# Patient Record
Sex: Female | Born: 1960 | Race: Black or African American | Hispanic: No | Marital: Married | Smoking: Current some day smoker
Health system: Southern US, Community
[De-identification: ages and names within clinical notes are randomized; demographics above are authoritative.]

## PROBLEM LIST (undated history)

## (undated) DIAGNOSIS — Z972 Presence of dental prosthetic device (complete) (partial): Secondary | ICD-10-CM

## (undated) DIAGNOSIS — K648 Other hemorrhoids: Secondary | ICD-10-CM

## (undated) DIAGNOSIS — J302 Other seasonal allergic rhinitis: Secondary | ICD-10-CM

## (undated) DIAGNOSIS — K5730 Diverticulosis of large intestine without perforation or abscess without bleeding: Secondary | ICD-10-CM

## (undated) DIAGNOSIS — Z789 Other specified health status: Secondary | ICD-10-CM

## (undated) HISTORY — PX: COLONOSCOPY: SHX174

---

## 1984-09-22 HISTORY — PX: LAPAROSCOPIC ABDOMINAL EXPLORATION: SHX6249

## 1997-11-10 ENCOUNTER — Inpatient Hospital Stay (HOSPITAL_COMMUNITY): Admission: RE | Admit: 1997-11-10 | Discharge: 1997-11-10 | Payer: Self-pay | Admitting: Gynecology

## 1997-12-19 ENCOUNTER — Inpatient Hospital Stay (HOSPITAL_COMMUNITY): Admission: AD | Admit: 1997-12-19 | Discharge: 1997-12-19 | Payer: Self-pay | Admitting: Obstetrics and Gynecology

## 1998-01-23 ENCOUNTER — Inpatient Hospital Stay (HOSPITAL_COMMUNITY): Admission: AD | Admit: 1998-01-23 | Discharge: 1998-01-23 | Payer: Self-pay | Admitting: Gynecology

## 2000-02-25 ENCOUNTER — Other Ambulatory Visit: Admission: RE | Admit: 2000-02-25 | Discharge: 2000-02-25 | Payer: Self-pay | Admitting: Obstetrics

## 2000-06-30 ENCOUNTER — Encounter: Payer: Self-pay | Admitting: Obstetrics

## 2000-06-30 ENCOUNTER — Ambulatory Visit (HOSPITAL_COMMUNITY): Admission: RE | Admit: 2000-06-30 | Discharge: 2000-06-30 | Payer: Self-pay | Admitting: Obstetrics

## 2000-07-20 ENCOUNTER — Encounter: Admission: RE | Admit: 2000-07-20 | Discharge: 2000-07-20 | Payer: Self-pay | Admitting: Family Medicine

## 2000-07-20 ENCOUNTER — Encounter: Payer: Self-pay | Admitting: Family Medicine

## 2003-09-05 ENCOUNTER — Ambulatory Visit (HOSPITAL_COMMUNITY): Admission: RE | Admit: 2003-09-05 | Discharge: 2003-09-05 | Payer: Self-pay | Admitting: Obstetrics

## 2003-11-27 ENCOUNTER — Emergency Department (HOSPITAL_COMMUNITY): Admission: AD | Admit: 2003-11-27 | Discharge: 2003-11-27 | Payer: Self-pay | Admitting: Family Medicine

## 2005-07-22 ENCOUNTER — Emergency Department (HOSPITAL_COMMUNITY): Admission: EM | Admit: 2005-07-22 | Discharge: 2005-07-22 | Payer: Self-pay | Admitting: Family Medicine

## 2007-01-14 ENCOUNTER — Emergency Department (HOSPITAL_COMMUNITY): Admission: EM | Admit: 2007-01-14 | Discharge: 2007-01-14 | Payer: Self-pay | Admitting: Family Medicine

## 2010-11-17 ENCOUNTER — Inpatient Hospital Stay (INDEPENDENT_AMBULATORY_CARE_PROVIDER_SITE_OTHER)
Admission: RE | Admit: 2010-11-17 | Discharge: 2010-11-17 | Disposition: A | Discharge status: Home / Self Care | Payer: Self-pay | Source: Ambulatory Visit | Attending: Family Medicine | Admitting: Family Medicine

## 2010-11-17 DIAGNOSIS — J309 Allergic rhinitis, unspecified: Secondary | ICD-10-CM

## 2010-11-17 DIAGNOSIS — H60399 Other infective otitis externa, unspecified ear: Secondary | ICD-10-CM

## 2011-11-17 ENCOUNTER — Inpatient Hospital Stay (HOSPITAL_COMMUNITY): Payer: Self-pay

## 2011-11-17 ENCOUNTER — Encounter (HOSPITAL_COMMUNITY): Payer: Self-pay | Admitting: *Deleted

## 2011-11-17 ENCOUNTER — Inpatient Hospital Stay (HOSPITAL_COMMUNITY)
Admission: AD | Admit: 2011-11-17 | Discharge: 2011-11-17 | Disposition: A | Discharge status: Home / Self Care | Payer: Self-pay | Source: Ambulatory Visit | Attending: Obstetrics & Gynecology | Admitting: Obstetrics & Gynecology

## 2011-11-17 DIAGNOSIS — N898 Other specified noninflammatory disorders of vagina: Secondary | ICD-10-CM

## 2011-11-17 DIAGNOSIS — D219 Benign neoplasm of connective and other soft tissue, unspecified: Secondary | ICD-10-CM

## 2011-11-17 DIAGNOSIS — R109 Unspecified abdominal pain: Secondary | ICD-10-CM | POA: Insufficient documentation

## 2011-11-17 DIAGNOSIS — M549 Dorsalgia, unspecified: Secondary | ICD-10-CM | POA: Insufficient documentation

## 2011-11-17 DIAGNOSIS — R102 Pelvic and perineal pain: Secondary | ICD-10-CM

## 2011-11-17 DIAGNOSIS — N939 Abnormal uterine and vaginal bleeding, unspecified: Secondary | ICD-10-CM

## 2011-11-17 DIAGNOSIS — N949 Unspecified condition associated with female genital organs and menstrual cycle: Secondary | ICD-10-CM

## 2011-11-17 DIAGNOSIS — D259 Leiomyoma of uterus, unspecified: Secondary | ICD-10-CM

## 2011-11-17 HISTORY — DX: Other specified health status: Z78.9

## 2011-11-17 LAB — URINALYSIS, ROUTINE W REFLEX MICROSCOPIC
Bilirubin Urine: NEGATIVE
Glucose, UA: NEGATIVE mg/dL
Ketones, ur: NEGATIVE mg/dL
Leukocytes, UA: NEGATIVE
Nitrite: NEGATIVE
Protein, ur: NEGATIVE mg/dL
Specific Gravity, Urine: 1.025 (ref 1.005–1.030)
Urobilinogen, UA: 0.2 mg/dL (ref 0.0–1.0)
pH: 6 (ref 5.0–8.0)

## 2011-11-17 LAB — CBC
HCT: 29.9 % — ABNORMAL LOW (ref 36.0–46.0)
Hemoglobin: 9.4 g/dL — ABNORMAL LOW (ref 12.0–15.0)
MCH: 29 pg (ref 26.0–34.0)
MCHC: 31.4 g/dL (ref 30.0–36.0)
MCV: 92.3 fL (ref 78.0–100.0)
Platelets: 450 10*3/uL — ABNORMAL HIGH (ref 150–400)
RBC: 3.24 MIL/uL — ABNORMAL LOW (ref 3.87–5.11)
RDW: 21.3 % — ABNORMAL HIGH (ref 11.5–15.5)
WBC: 11.4 10*3/uL — ABNORMAL HIGH (ref 4.0–10.5)

## 2011-11-17 LAB — URINE MICROSCOPIC-ADD ON

## 2011-11-17 LAB — WET PREP, GENITAL
Clue Cells Wet Prep HPF POC: NONE SEEN
Trich, Wet Prep: NONE SEEN

## 2011-11-17 MED ORDER — KETOROLAC TROMETHAMINE 60 MG/2ML IM SOLN
30.0000 mg | Freq: Once | INTRAMUSCULAR | Status: AC
  Administered 2011-11-17: 30 mg via INTRAMUSCULAR
  Filled 2011-11-17: qty 2

## 2011-11-17 MED ORDER — MEDROXYPROGESTERONE ACETATE 10 MG PO TABS: 10.0000 mg | ORAL_TABLET | Freq: Every day | ORAL | Status: DC

## 2011-11-17 NOTE — Discharge Instructions (Signed)
Fibroids You have been diagnosed as having a fibroid. Fibroids are smooth muscle lumps (tumors) which can occur any place in a woman's body. They are usually in the womb (uterus). The most common problem (symptom) of fibroids is bleeding. Over time this may cause low red blood cells (anemia). Other symptoms include feelings of pressure and pain in the pelvis. The diagnosis (learning what is wrong) of fibroids is made by physical exam. Sometimes tests such as an ultrasound are used. This is helpful when fibroids are felt around the ovaries and to look for tumors. TREATMENT   Most fibroids do not need surgical or medical treatment. Sometimes a tissue sample (biopsy) of the lining of the uterus is done to rule out cancer. If there is no cancer and only a small amount of bleeding, the problem can be watched.   Hormonal treatment can improve the problem.   When surgery is needed, it can consist of removing the fibroid. Vaginal birth may not be possible after the removal of fibroids. This depends on where they are and the extent of surgery. When pregnancy occurs with fibroids it is usually normal.   Your caregiver can help decide which treatments are best for you.  HOME CARE INSTRUCTIONS   Do not use aspirin as this may increase bleeding problems.   If your periods (menses) are heavy, record the number of pads or tampons used per month. Bring this information to your caregiver. This can help them determine the best treatment for you.  SEEK IMMEDIATE MEDICAL CARE IF:  You have pelvic pain or cramps not controlled with medications, or experience a sudden increase in pain.   You have an increase of pelvic bleeding between and during menses.   You feel lightheaded or have fainting spells.   You develop worsening belly (abdominal) pain.  Document Released: 09/05/2000 Document Revised: 05/21/2011 Document Reviewed: 04/27/2008 ExitCare Patient Information 2012 [employer]. 

## 2011-11-17 NOTE — ED Provider Notes (Signed)
History     Chief Complaint  Patient presents with  . Vaginal Bleeding  . Abdominal Pain   HPI Susan Coleman is 51 y.o. Reports 1 1/2  month of vaginal bleeding with clots and  cramping.  Went to [facility] today and sent her here.  Had not had GYN care in 3 years.  Never had ultrasound.  Saw Dr. Gaynell Face 3-4 years ago.  Has had regular menstrual cycles until December 2012.  She bled 3 weeks off one and since January has not stopped.  Last sexually active 2 weeks ago, 1 partner.    No past medical history on file.  No past surgical history on file.  No family history on file.  History  Substance Use Topics  . Smoking status: Not on file  . Smokeless tobacco: Not on file  . Alcohol Use: Not on file    Allergies: Allergies not on file  No prescriptions prior to admission    Review of Systems  Gastrointestinal: Positive for abdominal pain (lower, cramps).  Genitourinary:       + vaginal bleeding  Musculoskeletal: Positive for back pain.   Physical Exam   Blood pressure 118/95, pulse 94, temperature 97.7 F (36.5 C), resp. rate 18, height 5\' 2"  (1.575 m), weight 60.782 kg (134 lb), last menstrual period 09/29/2011, SpO2 98.00%.  Physical Exam  Constitutional: She is oriented to person, place, and time. She appears well-developed and well-nourished. No distress.  HENT:  Head: Normocephalic.  Neck: Normal range of motion.  Cardiovascular: Normal rate.   Respiratory: Effort normal.  GI: Soft. She exhibits no distension and no mass. There is tenderness (lower abd). There is no rebound and no guarding.  Genitourinary: Uterus is enlarged (irregular measures 16 week size) and tender (moderatly). Right adnexum displays no fullness. Left adnexum displays no fullness. There is bleeding (small amount with 1 large clot and several small ones) around the vagina.  Neurological: She is alert and oriented to person, place, and time.  Skin: Skin is warm and dry.    Results for orders placed during the hospital encounter of 11/17/11 (from the past 24 hour(s))  URINALYSIS, ROUTINE W REFLEX MICROSCOPIC     Status: Abnormal   Collection Time   11/17/11  7:58 PM      Component Value Range   Color, Urine YELLOW  YELLOW    APPearance CLEAR  CLEAR    Specific Gravity, Urine 1.025  1.005 - 1.030    pH 6.0  5.0 - 8.0    Glucose, UA NEGATIVE  NEGATIVE (mg/dL)   Hgb urine dipstick MODERATE (*) NEGATIVE    Bilirubin Urine NEGATIVE  NEGATIVE    Ketones, ur NEGATIVE  NEGATIVE (mg/dL)   Protein, ur NEGATIVE  NEGATIVE (mg/dL)   Urobilinogen, UA 0.2  0.0 - 1.0 (mg/dL)   Nitrite NEGATIVE  NEGATIVE    Leukocytes, UA NEGATIVE  NEGATIVE   URINE MICROSCOPIC-ADD ON     Status: Abnormal   Collection Time   11/17/11  7:58 PM      Component Value Range   Squamous Epithelial / LPF FEW (*) RARE    RBC / HPF 21-50  <3 (RBC/hpf)   Bacteria, UA RARE  RARE   CBC     Status: Abnormal   Collection Time   11/17/11  8:00 PM      Component Value Range   WBC 11.4 (*) 4.0 - 10.5 (K/uL)   RBC 3.24 (*) 3.87 - 5.11 (MIL/uL)  Hemoglobin 9.4 (*) 12.0 - 15.0 (g/dL)   HCT 16.1 (*) 09.6 - 46.0 (%)   MCV 92.3  78.0 - 100.0 (fL)   MCH 29.0  26.0 - 34.0 (pg)   MCHC 31.4  30.0 - 36.0 (g/dL)   RDW 04.5 (*) 40.9 - 15.5 (%)   Platelets 450 (*) 150 - 400 (K/uL)  POCT PREGNANCY, URINE     Status: Normal   Collection Time   11/17/11  8:18 PM      Component Value Range   Preg Test, Ur NEGATIVE  NEGATIVE   WET PREP, GENITAL     Status: Abnormal   Collection Time   11/17/11  8:50 PM      Component Value Range   Yeast Wet Prep HPF POC NONE SEEN  NONE SEEN    Trich, Wet Prep NONE SEEN  NONE SEEN    Clue Cells Wet Prep HPF POC NONE SEEN  NONE SEEN    WBC, Wet Prep HPF POC FEW (*) NONE SEEN             Study Result     *RADIOLOGY REPORT*  Clinical Data: Pelvic pain. Persistent vaginal bleeding.  TRANSABDOMINAL AND TRANSVAGINAL ULTRASOUND OF PELVIS  Technique: Both transabdominal  and transvaginal ultrasound  examinations of the pelvis were performed. Transabdominal technique  was performed for global imaging of the pelvis including uterus,  ovaries, adnexal regions, and pelvic cul-de-sac.  Comparison: None.  It was necessary to proceed with endovaginal exam following the  transabdominal exam to visualize the uterus and ovaries in greater  detail.  Findings:  Uterus: Enlarged, measuring 11.9 x 6.6 x 7.6 cm. There are at  least five leiomyomata seen within the uterus, the largest of which  occupies the mid uterus, measuring 3.8 x 3.7 x 3.4 cm, with a  prominent submucosal component.  Endometrium: Not characterized due to the submucosal fibroid.  Right ovary: Not seen.  Left ovary: Normal appearance/no adnexal mass; measures 3.3 x 1.7 x  2.4 cm.  Other findings: No free fluid seen within the pelvic cul-de-sac.  IMPRESSION:  1. Enlarged fibroid uterus, containing at least five leiomyomata;  the largest is submucosal in nature, measuring 3.8 x 3.7 x 3.4 cm.  The endometrium is not well characterized due to the submucosal  fibroid.  2. Otherwise unremarkable pelvic ultrasound; right ovary not seen.  Original Report Authenticated By: Tonia Ghent, M.D.    MAU Course  Procedures GC/CHL culture to lab  MDM   Patient is asking for pain med after return from ultrasound.  Toradol 30mg  IM ordered.   Assessment and Plan  A: Abnormal Vaginal Bleeding     Abdominal pain     Multiple Uterine Fibroids  P:  Request for appointment emailed to GYN CLINIC.       Provera 10mg  daily X 10 days.  Gracelynne Benedict,EVE M 11/17/2011, 7:40 PM   Matt Holmes, NP 11/17/11 2211  Matt Holmes, NP 11/17/11 2216

## 2011-11-17 NOTE — Progress Notes (Signed)
Pt reports vaginal bleeding since January 7th, changing a pad every hour x 2 days. Has not had a PAP in 3 yrs due to lack of insurance. Lower abd pain since bleeding started. Taking aspirin and advil without releif.

## 2011-11-18 LAB — GC/CHLAMYDIA PROBE AMP, GENITAL
Chlamydia, DNA Probe: NEGATIVE
GC Probe Amp, Genital: NEGATIVE

## 2013-07-29 ENCOUNTER — Other Ambulatory Visit: Payer: Self-pay | Admitting: Family Medicine

## 2013-07-29 ENCOUNTER — Other Ambulatory Visit (HOSPITAL_COMMUNITY)
Admission: RE | Admit: 2013-07-29 | Discharge: 2013-07-29 | Disposition: A | Discharge status: Home / Self Care | Payer: 59 | Source: Ambulatory Visit | Attending: Family Medicine | Admitting: Family Medicine

## 2013-07-29 ENCOUNTER — Ambulatory Visit
Admission: RE | Admit: 2013-07-29 | Discharge: 2013-07-29 | Disposition: A | Discharge status: Home / Self Care | Payer: 59 | Source: Ambulatory Visit | Attending: Family Medicine | Admitting: Family Medicine

## 2013-07-29 DIAGNOSIS — M545 Low back pain, unspecified: Secondary | ICD-10-CM

## 2013-07-29 DIAGNOSIS — Z1151 Encounter for screening for human papillomavirus (HPV): Secondary | ICD-10-CM | POA: Insufficient documentation

## 2013-07-29 DIAGNOSIS — Z01419 Encounter for gynecological examination (general) (routine) without abnormal findings: Secondary | ICD-10-CM | POA: Insufficient documentation

## 2014-02-01 ENCOUNTER — Other Ambulatory Visit (HOSPITAL_COMMUNITY)
Admission: RE | Admit: 2014-02-01 | Discharge: 2014-02-01 | Disposition: A | Discharge status: Home / Self Care | Payer: 59 | Source: Ambulatory Visit | Attending: Family Medicine | Admitting: Family Medicine

## 2014-02-01 ENCOUNTER — Other Ambulatory Visit: Payer: Self-pay | Admitting: Family Medicine

## 2014-02-01 DIAGNOSIS — Z113 Encounter for screening for infections with a predominantly sexual mode of transmission: Secondary | ICD-10-CM | POA: Insufficient documentation

## 2014-02-01 DIAGNOSIS — N760 Acute vaginitis: Secondary | ICD-10-CM | POA: Insufficient documentation

## 2014-07-24 ENCOUNTER — Encounter (HOSPITAL_COMMUNITY): Payer: Self-pay | Admitting: *Deleted

## 2015-02-06 ENCOUNTER — Inpatient Hospital Stay (HOSPITAL_COMMUNITY)
Admission: AD | Admit: 2015-02-06 | Discharge: 2015-02-06 | Disposition: A | Discharge status: Home / Self Care | Payer: 59 | Source: Ambulatory Visit | Attending: Family Medicine | Admitting: Family Medicine

## 2015-02-06 ENCOUNTER — Encounter (HOSPITAL_COMMUNITY): Payer: Self-pay | Admitting: Advanced Practice Midwife

## 2015-02-06 DIAGNOSIS — L03317 Cellulitis of buttock: Secondary | ICD-10-CM | POA: Diagnosis not present

## 2015-02-06 DIAGNOSIS — R229 Localized swelling, mass and lump, unspecified: Secondary | ICD-10-CM | POA: Diagnosis present

## 2015-02-06 MED ORDER — CEFDINIR 300 MG PO CAPS: 300.0000 mg | ORAL_CAPSULE | Freq: Two times a day (BID) | ORAL | Status: DC

## 2015-02-06 MED ORDER — TRAMADOL HCL 50 MG PO TABS
50.0000 mg | ORAL_TABLET | Freq: Four times a day (QID) | ORAL | Status: DC | PRN
Start: 2015-02-06 — End: 2015-12-20

## 2015-02-06 NOTE — MAU Provider Note (Signed)
Chief Complaint: No chief complaint on file.   First Provider Initiated Contact with Patient 02/06/15 1949     SUBJECTIVE HPI: Susan Coleman is a 54 y.o. [phone] postmenopausal female who presents to Maternity Admissions reporting painful lump on her right buttock. Thinks she got a bug bite 02/03/2015. Initially there was a small, mildly tender, pruritic bump that has grown and become firm and very tender. She states she has tried warm compresses in Epsom salts soaks without any improvement in the pain or size of the mass..  Denies fever, chills or drainage from the site. No history of similar problems. Denies recent hospitalization.  Past Medical History  Diagnosis Date  . No pertinent past medical history    OB History  Gravida Para Term Preterm AB SAB TAB Ectopic Multiple Living  2 2 2       2     # Outcome Date GA Lbr Len/2nd Weight Sex Delivery Anes PTL Lv  2 Term           1 Term              Past Surgical History  Procedure Laterality Date  . Exploratory laparotomy     History   Social History  . Marital Status: Married    Spouse Name: N/A  . Number of Children: N/A  . Years of Education: N/A   Occupational History  . Not on file.   Social History Main Topics  . Smoking status: Never Smoker   . Smokeless tobacco: Not on file  . Alcohol Use: No  . Drug Use: No  . Sexual Activity: Yes   Other Topics Concern  . Not on file   Social History Narrative   No current facility-administered medications on file prior to encounter.   Current Outpatient Prescriptions on File Prior to Encounter  Medication Sig Dispense Refill  . ibuprofen (ADVIL,MOTRIN) 200 MG tablet Take 800 mg by mouth every 6 (six) hours as needed. For pain.    . medroxyPROGESTERone (PROVERA) 10 MG tablet Take 1 tablet (10 mg total) by mouth daily. 10 tablet 0  . Multiple Vitamin (MULITIVITAMIN WITH MINERALS) TABS Take 1 tablet by mouth daily.     No Known Allergies  Review of Systems   Constitutional: Negative for fever and chills.  Skin: Positive for itching.       Positive for tender mass right buttock.    OBJECTIVE Blood pressure 123/90, pulse 110, temperature 99.2 F (37.3 C), temperature source Oral, resp. rate 18, height 5\' 4"  (1.626 m), weight 125 lb (56.7 kg), last menstrual period 09/29/2011. GENERAL: Well-developed, well-nourished female in no acute distress.  HEART: normal rate RESP: normal effort SKIN: 3 x 6 cm firm, tender, warm, nonfluctuant mass at right gluteal fold. Unable to assess for erythema due to the very dark complexion. No drainage or bleeding. MS: Nontender, no edema NEURO: Alert and oriented SPECULUM EXAM: Deferred.  LAB RESULTS No results found for this or any previous visit (from the past 24 hour(s)).  IMAGING No results found.  MAU COURSE  ASSESSMENT 1. Cellulitis of buttock    PLAN Discharge home in stable condition per consult with Dr. Nehemiah Settle. Continue frequent compresses and soaks.  Follow-up Information    Follow up with Elyn Peers, MD.   Specialty:  Family Medicine   Why:  If no improvement in 2-3 days.   Contact information:   [address] [place] [address] [phone]  Follow up with [facility].   Why:  For fever greater than 100.4 or As needed if symptoms worsen   Contact information:   [place] [id number]        Medication List    STOP taking these medications        medroxyPROGESTERone 10 MG tablet  Commonly known as:  PROVERA      TAKE these medications        cefdinir 300 MG capsule  Commonly known as:  OMNICEF  Take 1 capsule (300 mg total) by mouth 2 (two) times daily.     ibuprofen 200 MG tablet  Commonly known as:  ADVIL,MOTRIN  Take 800 mg by mouth every 6 (six) hours as needed. For pain.     multivitamin with minerals Tabs tablet  Take 1 tablet by mouth daily.     traMADol 50 MG tablet  Commonly known as:  ULTRAM  Take  1-2 tablets (50-100 mg total) by mouth every 6 (six) hours as needed for severe pain.       [place], CNM 02/06/2015  8:12 PM

## 2015-02-06 NOTE — Discharge Instructions (Signed)

## 2015-02-06 NOTE — MAU Note (Signed)
Pt reports 3 days ago she got a ? Bug bite on her upper right thigh./buttock. The area itches, burns and is painful.

## 2015-07-18 ENCOUNTER — Other Ambulatory Visit: Payer: Self-pay | Admitting: Neurology

## 2015-07-18 MED ORDER — FLUTICASONE PROPIONATE 50 MCG/ACT NA SUSP: 2.0000 | Freq: Every day | NASAL | Status: DC

## 2015-09-03 ENCOUNTER — Other Ambulatory Visit: Payer: Self-pay | Admitting: Neurology

## 2015-09-03 MED ORDER — FLUTICASONE PROPIONATE 50 MCG/ACT NA SUSP: 2.0000 | Freq: Every day | NASAL | Status: DC

## 2015-12-20 ENCOUNTER — Ambulatory Visit (INDEPENDENT_AMBULATORY_CARE_PROVIDER_SITE_OTHER): Payer: 59 | Admitting: Family Medicine

## 2015-12-20 ENCOUNTER — Encounter: Payer: Self-pay | Admitting: Family Medicine

## 2015-12-20 VITALS — BP 110/70 | HR 98 | Temp 98.5°F | Ht 64.0 in | Wt 126.0 lb

## 2015-12-20 DIAGNOSIS — J309 Allergic rhinitis, unspecified: Secondary | ICD-10-CM

## 2015-12-20 DIAGNOSIS — J0100 Acute maxillary sinusitis, unspecified: Secondary | ICD-10-CM | POA: Diagnosis not present

## 2015-12-20 DIAGNOSIS — Z0000 Encounter for general adult medical examination without abnormal findings: Secondary | ICD-10-CM | POA: Insufficient documentation

## 2015-12-20 DIAGNOSIS — J019 Acute sinusitis, unspecified: Secondary | ICD-10-CM | POA: Insufficient documentation

## 2015-12-20 MED ORDER — AMOXICILLIN-POT CLAVULANATE 875-125 MG PO TABS: 1.0000 | ORAL_TABLET | Freq: Two times a day (BID) | ORAL | Status: DC

## 2015-12-20 NOTE — Assessment & Plan Note (Signed)
Up-to-date on Pap smear - 2014 normal and HPV negative, no history of abnormal Pap smears, repeat Pap smear in 2019 Advised patient on colonoscopy and given information for gastroenterologists in the area

## 2015-12-20 NOTE — Patient Instructions (Signed)

## 2015-12-20 NOTE — Assessment & Plan Note (Signed)
History and time course consistent with acute bacterial rhinosinusitis Advised patient to quit smoking Treat with Augmentin twice a day for 7 days Return precautions discussed Advised humidifier for nosebleeds

## 2015-12-20 NOTE — Assessment & Plan Note (Signed)
Continue Singulair and Flonase Patient instructed on correct use of Flonase Advised patient to consider using Allegra without decongestant to avoid rebound congestion

## 2015-12-20 NOTE — Progress Notes (Signed)
   Subjective:   Susan Coleman is a 55 y.o. female with a history of allergic rhinitis here for new patient evaluation and possible sinus infection  Sinus infection - was seen in the UC and diagnosed with flu anf given tamiflu - still not feeling better - some facial pain - nose bleeding regularly - green malodorous nasal discharge - no cough, fevers - R ear pain as well - all going on ~1 month - using flonase daily - body aches have resolved.  Review of Systems:  Per HPI.   Social History: current smoker - 3 cig/wk - wants to quit  Objective:  BP 110/70 mmHg  Pulse 98  Temp(Src) 98.5 F (36.9 C) (Oral)  Ht 5\' 4"  (1.626 m)  Wt 126 lb (57.153 kg)  BMI 21.62 kg/m2  SpO2 97%  LMP 09/29/2011  Gen:  55 y.o. female in NAD HEENT: NCAT, MMM, EOMI, PERRL, anicteric sclerae, TMs clear, OP clear, TTP over maxillary sinusus Neck: supple, no LAD CV: RRR, no MRG Resp: Non-labored, CTAB, no wheezes noted Ext: WWP, no edema MSK: No obvious deformities Neuro: Alert and oriented, speech normal     Assessment & Plan:     Susan Coleman is a 55 y.o. female here for   Allergic rhinitis Continue Singulair and Flonase Patient instructed on correct use of Flonase Advised patient to consider using Allegra without decongestant to avoid rebound congestion  Acute sinusitis History and time course consistent with acute bacterial rhinosinusitis Advised patient to quit smoking Treat with Augmentin twice a day for 7 days Return precautions discussed Advised humidifier for nosebleeds  Healthcare maintenance Up-to-date on Pap smear - 2014 normal and HPV negative, no history of abnormal Pap smears, repeat Pap smear in 2019 Advised patient on colonoscopy and given information for gastroenterologists in the area     Virginia Crews, MD MPH PGY-2,  [facility] 12/20/2015  4:39 PM

## 2015-12-21 NOTE — Progress Notes (Signed)
ROI faxed to Dr. Sheilah Mins office Susan Coleman, Susan Coleman, Susan Coleman

## 2016-01-21 ENCOUNTER — Ambulatory Visit (INDEPENDENT_AMBULATORY_CARE_PROVIDER_SITE_OTHER): Payer: 59 | Admitting: Family Medicine

## 2016-01-21 VITALS — BP 119/96 | HR 101 | Temp 98.0°F | Wt 121.3 lb

## 2016-01-21 DIAGNOSIS — Z0000 Encounter for general adult medical examination without abnormal findings: Secondary | ICD-10-CM

## 2016-01-21 DIAGNOSIS — R0981 Nasal congestion: Secondary | ICD-10-CM | POA: Diagnosis not present

## 2016-01-21 DIAGNOSIS — R300 Dysuria: Secondary | ICD-10-CM

## 2016-01-21 DIAGNOSIS — B9689 Other specified bacterial agents as the cause of diseases classified elsewhere: Secondary | ICD-10-CM

## 2016-01-21 DIAGNOSIS — N760 Acute vaginitis: Secondary | ICD-10-CM

## 2016-01-21 DIAGNOSIS — A499 Bacterial infection, unspecified: Secondary | ICD-10-CM

## 2016-01-21 LAB — POCT URINALYSIS DIPSTICK
BILIRUBIN UA: NEGATIVE
Glucose, UA: NEGATIVE
Leukocytes, UA: NEGATIVE
Nitrite, UA: NEGATIVE
PH UA: 6
Spec Grav, UA: 1.025
Urobilinogen, UA: 1

## 2016-01-21 LAB — POCT UA - MICROSCOPIC ONLY

## 2016-01-21 LAB — HIV ANTIBODY (ROUTINE TESTING W REFLEX): HIV 1&2 Ab, 4th Generation: NONREACTIVE

## 2016-01-21 LAB — HEPATITIS C ANTIBODY: HCV Ab: NEGATIVE

## 2016-01-21 MED ORDER — METRONIDAZOLE 500 MG PO TABS: 500.0000 mg | ORAL_TABLET | Freq: Two times a day (BID) | ORAL | Status: DC

## 2016-01-21 NOTE — Progress Notes (Signed)
   Subjective:   Susan Coleman is a 55 y.o. female with a history of allergic rhinitis here for well woman/preventative visit  Acute Concerns: Nasal congestion - hasnt gotten any better - just stopped taking allegra-d and starting non decongestant allegra tomorrow - nose seems raw from blowing it so much  External discomfort after urination - no dysuria, abd pain, flank pain, fevers - no rashes present - douches many times weekly - has had BV and yeast infections in the past and this feels similar  Diet: eats healthy per report, but wants to gain a little weight  Exercise: gym twice weekly, weights  Sexual/Birth History: G2P2, sexually active with husband  Birth Control: postmenopausal  POA/Living Will: no   Review of Systems: Per HPI.   PMH, PSH, Medications, Allergies, and FmHx reviewed and updated in EMR.  Social History: current smoker - 1-2 cigs/week, trying to quit  Immunization:  Tdap/TD: reportedly 10/2012 - will request records  Influenza: out of season  Pneumococcal: n/a  Herpes Zoster: n/a  Cancer Screening:  Pap Smear: normal in 2014, no previous abnormal  Mammogram: a long time ago  Colonoscopy: never had one  Dexa: n/a   Objective:  BP 119/96 mmHg  Pulse 101  Temp(Src) 98 F (36.7 C) (Oral)  Wt 121 lb 4.8 oz (55.021 kg)  LMP 09/29/2011  Gen:  55 y.o. female in NAD HEENT: NCAT, MMM, EOMI, PERRL, anicteric sclerae, nasal septum shifted to L side CV: RRR, no MRG Resp: Non-labored, CTAB, no wheezes noted Abd: Soft, NTND, BS present, no guarding or organomegaly Gyn: declines today Ext: WWP, no edema MSK: Full ROM, strength intact Neuro: Alert and oriented, speech normal       Assessment & Plan:     Susan Coleman is a 55 y.o. female here for annual well woman/preventative exam and GYN exam.  Healthcare maintenance Hep C, HIV, CMET, lipid panel today for screening Colonoscopy and mammography information given UTD on pap Will request  records from prev PCP  Nasal congestion Likely related to rebound from multiple decongestants Nasal septum is significantly deviated on exam, however Referral to ENT placed  Bacterial vaginitis UA clean, but symptoms more suggestive of BV Advised gentle cleansing and no douching Flagyl BID x7d F/u if no improvement    Virginia Crews, MD MPH PGY-2,  [facility] 01/21/2016  3:29 PM

## 2016-01-21 NOTE — Assessment & Plan Note (Signed)
Likely related to rebound from multiple decongestants Nasal septum is significantly deviated on exam, however Referral to ENT placed

## 2016-01-21 NOTE — Assessment & Plan Note (Signed)
Hep C, HIV, CMET, lipid panel today for screening Colonoscopy and mammography information given UTD on pap Will request records from prev PCP

## 2016-01-21 NOTE — Patient Instructions (Signed)
Nice to see you again today. We are getting some labs and someone will call you or send you a letter with the results when they're available. Take Flagyl twice daily for the next week to treat bacterial vaginosis. If the sensation is not getting better after this treatment, come back to see me. Clean less aggressively as we discussed.  Take care, Dr. B   Things to do to keep yourself healthy  - Exercise at least 30-45 minutes a day, 3-4 days a week.  - Eat a low-fat diet with lots of fruits and vegetables, up to 7-9 servings per day.  - Seatbelts can save your life. Wear them always.  - Smoke detectors on every level of your home, check batteries every year.  - Eye Doctor - have an eye exam every 1-2 years  - Safe sex - if you may be exposed to STDs, use a condom.  - Alcohol -  If you drink, do it moderately, less than 2 drinks per day.  - [place]. Choose someone to speak for you if you are not able.  - Depression is common in our stressful world.If you're feeling down or losing interest in things you normally enjoy, please come in for a visit.  - Violence - If anyone is threatening or hurting you, please call immediately.   Bacterial Vaginosis Bacterial vaginosis is a vaginal infection that occurs when the normal balance of bacteria in the vagina is disrupted. It results from an overgrowth of certain bacteria. This is the most common vaginal infection in women of childbearing age. Treatment is important to prevent complications, especially in pregnant women, as it can cause a premature delivery. CAUSES  Bacterial vaginosis is caused by an increase in harmful bacteria that are normally present in smaller amounts in the vagina. Several different kinds of bacteria can cause bacterial vaginosis. However, the reason that the condition develops is not fully understood. RISK FACTORS Certain activities or behaviors can put you at an increased risk of developing bacterial  vaginosis, including:  Having a new sex partner or multiple sex partners.  Douching.  Using an intrauterine device (IUD) for contraception. Women do not get bacterial vaginosis from toilet seats, bedding, swimming pools, or contact with objects around them. SIGNS AND SYMPTOMS  Some women with bacterial vaginosis have no signs or symptoms. Common symptoms include:  Grey vaginal discharge.  A fishlike odor with discharge, especially after sexual intercourse.  Itching or burning of the vagina and vulva.  Burning or pain with urination. DIAGNOSIS  Your health care provider will take a medical history and examine the vagina for signs of bacterial vaginosis. A sample of vaginal fluid may be taken. Your health care provider will look at this sample under a microscope to check for bacteria and abnormal cells. A vaginal pH test may also be done.  TREATMENT  Bacterial vaginosis may be treated with antibiotic medicines. These may be given in the form of a pill or a vaginal cream. A second round of antibiotics may be prescribed if the condition comes back after treatment. Because bacterial vaginosis increases your risk for sexually transmitted diseases, getting treated can help reduce your risk for chlamydia, gonorrhea, HIV, and herpes. HOME CARE INSTRUCTIONS   Only take over-the-counter or prescription medicines as directed by your health care provider.  If antibiotic medicine was prescribed, take it as directed. Make sure you finish it even if you start to feel better.  Tell all sexual partners that  you have a vaginal infection. They should see their health care provider and be treated if they have problems, such as a mild rash or itching.  During treatment, it is important that you follow these instructions:  Avoid sexual activity or use condoms correctly.  Do not douche.  Avoid alcohol as directed by your health care provider.  Avoid breastfeeding as directed by your health care  provider. SEEK MEDICAL CARE IF:   Your symptoms are not improving after 3 days of treatment.  You have increased discharge or pain.  You have a fever. MAKE SURE YOU:   Understand these instructions.  Will watch your condition.  Will get help right away if you are not doing well or get worse. FOR MORE INFORMATION  Centers for Disease Control and Prevention, Division of STD Prevention: [employer] American Sexual Health Association (ASHA): www.ashastd.org    This information is not intended to replace advice given to you by your health care provider. Make sure you discuss any questions you have with your health care provider.   Document Released: 09/08/2005 Document Revised: 09/29/2014 Document Reviewed: 04/20/2013 Elsevier Interactive Patient Education [employer].

## 2016-01-21 NOTE — Assessment & Plan Note (Signed)
UA clean, but symptoms more suggestive of BV Advised gentle cleansing and no douching Flagyl BID x7d F/u if no improvement

## 2016-01-22 LAB — LIPID PANEL
CHOL/HDL RATIO: 2.7 ratio (ref <=5.0)
Cholesterol: 172 mg/dL (ref 125–200)
HDL: 63 mg/dL (ref >=46)
LDL CALC: 80 mg/dL (ref <130)
Triglycerides: 145 mg/dL (ref <150)
VLDL: 29 mg/dL (ref <30)

## 2016-01-22 LAB — COMPLETE METABOLIC PANEL WITH GFR
ALT: 12 U/L (ref 6–29)
AST: 19 U/L (ref 10–35)
Albumin: 4.2 g/dL (ref 3.6–5.1)
Alkaline Phosphatase: 76 U/L (ref 33–130)
BUN: 20 mg/dL (ref 7–25)
CHLORIDE: 101 mmol/L (ref 98–110)
CO2: 30 mmol/L (ref 20–31)
Calcium: 10.4 mg/dL (ref 8.6–10.4)
Creat: 1.03 mg/dL (ref 0.50–1.05)
GFR, Est African American: 71 mL/min (ref >=60)
GFR, Est Non African American: 61 mL/min (ref >=60)
GLUCOSE: 87 mg/dL (ref 65–99)
POTASSIUM: 3.9 mmol/L (ref 3.5–5.3)
SODIUM: 140 mmol/L (ref 135–146)
Total Bilirubin: 0.4 mg/dL (ref 0.2–1.2)
Total Protein: 7.5 g/dL (ref 6.1–8.1)

## 2016-01-24 ENCOUNTER — Encounter: Payer: Self-pay | Admitting: Family Medicine

## 2016-05-11 ENCOUNTER — Other Ambulatory Visit: Payer: Self-pay | Admitting: Allergy and Immunology

## 2016-05-22 ENCOUNTER — Other Ambulatory Visit: Payer: Self-pay | Admitting: Allergy and Immunology

## 2016-09-07 ENCOUNTER — Other Ambulatory Visit: Payer: Self-pay | Admitting: Allergy and Immunology

## 2016-10-19 ENCOUNTER — Ambulatory Visit (HOSPITAL_COMMUNITY)
Admission: EM | Admit: 2016-10-19 | Discharge: 2016-10-19 | Disposition: A | Discharge status: Home / Self Care | Payer: 59 | Attending: Family Medicine | Admitting: Family Medicine

## 2016-10-19 ENCOUNTER — Encounter (HOSPITAL_COMMUNITY): Payer: Self-pay | Admitting: Emergency Medicine

## 2016-10-19 DIAGNOSIS — H6501 Acute serous otitis media, right ear: Secondary | ICD-10-CM | POA: Diagnosis not present

## 2016-10-19 DIAGNOSIS — J40 Bronchitis, not specified as acute or chronic: Secondary | ICD-10-CM

## 2016-10-19 DIAGNOSIS — R05 Cough: Secondary | ICD-10-CM | POA: Diagnosis not present

## 2016-10-19 DIAGNOSIS — R059 Cough, unspecified: Secondary | ICD-10-CM

## 2016-10-19 MED ORDER — BENZONATATE 100 MG PO CAPS: 200.0000 mg | ORAL_CAPSULE | Freq: Three times a day (TID) | ORAL | 0 refills | Status: DC | PRN

## 2016-10-19 MED ORDER — IPRATROPIUM BROMIDE 0.06 % NA SOLN: 2.0000 | Freq: Four times a day (QID) | NASAL | 0 refills | Status: DC

## 2016-10-19 MED ORDER — AMOXICILLIN 875 MG PO TABS
875.0000 mg | ORAL_TABLET | Freq: Two times a day (BID) | ORAL | 0 refills | Status: DC
Start: 2016-10-19 — End: 2016-11-02

## 2016-10-19 NOTE — ED Provider Notes (Signed)
CSN: [id number]     Arrival date & time 10/19/16  1938 History   First MD Initiated Contact with Patient 10/19/16 2040     Chief Complaint  Patient presents with  . URI   (Consider location/radiation/quality/duration/timing/severity/associated sxs/prior Treatment) Patient has right ear pain and cough and uri sx's for over 2 weeks.   The history is provided by the patient.  URI  Presenting symptoms: congestion, cough, ear pain and fever   Severity:  Moderate Onset quality:  Gradual Duration:  2 weeks Timing:  Constant Progression:  Unchanged Chronicity:  New Relieved by:  Nothing Worsened by:  Nothing Ineffective treatments:  None tried   Past Medical History:  Diagnosis Date  . No pertinent past medical history    Past Surgical History:  Procedure Laterality Date  . LAPAROSCOPIC ABDOMINAL EXPLORATION  1986   Family History  Problem Relation Age of Onset  . Stroke Mother 92  . Cancer Mother     unsure what type  . High blood pressure Mother    Social History  Substance Use Topics  . Smoking status: Current Some Day Smoker  . Smokeless tobacco: Not on file     Comment: 3 cigs per week  . Alcohol use 1.2 oz/week    2 Standard drinks or equivalent per week   OB History    Gravida Para Term Preterm AB Living   2 2 2     2    SAB TAB Ectopic Multiple Live Births                 Review of Systems  Constitutional: Positive for fever.  HENT: Positive for congestion and ear pain.   Eyes: Negative.   Respiratory: Positive for cough.   Cardiovascular: Negative.   Gastrointestinal: Negative.   Endocrine: Negative.   Genitourinary: Negative.   Musculoskeletal: Negative.   Allergic/Immunologic: Negative.   Neurological: Negative.   Hematological: Negative.   Psychiatric/Behavioral: Negative.     Allergies  Patient has no known allergies.  Home Medications   Prior to Admission medications   Medication Sig Start Date End Date Taking? Authorizing Provider   amoxicillin (AMOXIL) 875 MG tablet Take 1 tablet (875 mg total) by mouth 2 (two) times daily. 10/19/16   Lysbeth Penner, FNP  amoxicillin-clavulanate (AUGMENTIN) 875-125 MG tablet Take 1 tablet by mouth 2 (two) times daily. Patient not taking: Reported on 10/19/2016 12/20/15   Virginia Crews, MD  benzonatate (TESSALON) 100 MG capsule Take 2 capsules (200 mg total) by mouth 3 (three) times daily as needed for cough. 10/19/16   Lysbeth Penner, FNP  fexofenadine-pseudoephedrine (ALLEGRA-D) 60-120 MG 12 hr tablet Take 1 tablet by mouth daily.    Historical Provider, MD  fluticasone (FLONASE) 50 MCG/ACT nasal spray Place 2 sprays into both nostrils daily. Patient not taking: Reported on 10/19/2016 09/03/15   Jiles Prows, MD  ipratropium (ATROVENT) 0.06 % nasal spray Place 2 sprays into both nostrils 4 (four) times daily. 10/19/16   Lysbeth Penner, FNP  metroNIDAZOLE (FLAGYL) 500 MG tablet Take 1 tablet (500 mg total) by mouth 2 (two) times daily. Patient not taking: Reported on 10/19/2016 01/21/16   Virginia Crews, MD  montelukast (SINGULAIR) 10 MG tablet Take 10 mg by mouth at bedtime.    Historical Provider, MD  Multiple Vitamin (MULITIVITAMIN WITH MINERALS) TABS Take 1 tablet by mouth daily.    Historical Provider, MD   Meds Ordered and Administered this Visit  Medications - No  data to display  BP 119/57 (BP Location: Left Arm)   Pulse 87   Temp 98.8 F (37.1 C) (Oral)   Resp 16   LMP 09/29/2011   SpO2 100%  No data found.   Physical Exam  Constitutional: She appears well-developed and well-nourished.  HENT:  Head: Normocephalic and atraumatic.  Right Ear: External ear normal.  Left Ear: External ear normal.  Mouth/Throat: Oropharynx is clear and moist.  Right TM erythematous  Eyes: Conjunctivae and EOM are normal. Pupils are equal, round, and reactive to light.  Neck: Normal range of motion. Neck supple.  Cardiovascular: Normal rate, regular rhythm and normal heart  sounds.   Pulmonary/Chest: Effort normal and breath sounds normal.  Nursing note and vitals reviewed.   Urgent Care Course     Procedures (including critical care time)  Labs Review Labs Reviewed - No data to display  Imaging Review No results found.   Visual Acuity Review  Right Eye Distance:   Left Eye Distance:   Bilateral Distance:    Right Eye Near:   Left Eye Near:    Bilateral Near:         MDM   1. Right acute serous otitis media, recurrence not specified   2. Bronchitis   3. Cough     Amoxicillin 875mg  one po bid x 7 days #14 Atrovent Nasal Spray Tessalon Perles  Push po fluids, rest, tylenol and motrin otc prn as directed for fever, arthralgias, and myalgias.  Follow up prn if sx's continue or persist.   Lysbeth Penner, FNP 10/19/16 2048

## 2016-10-19 NOTE — ED Triage Notes (Signed)
Weak, very tired, chest congestion.  See s/s

## 2016-10-19 NOTE — ED Notes (Signed)
Bed: UC07 Expected date:  Expected time:  Means of arrival:  Comments: Closed 

## 2016-11-02 ENCOUNTER — Encounter (HOSPITAL_COMMUNITY): Payer: Self-pay | Admitting: Emergency Medicine

## 2016-11-02 ENCOUNTER — Ambulatory Visit (HOSPITAL_COMMUNITY)
Admission: EM | Admit: 2016-11-02 | Discharge: 2016-11-02 | Disposition: A | Discharge status: Home / Self Care | Payer: 59 | Attending: Internal Medicine | Admitting: Internal Medicine

## 2016-11-02 DIAGNOSIS — R0981 Nasal congestion: Secondary | ICD-10-CM

## 2016-11-02 DIAGNOSIS — J209 Acute bronchitis, unspecified: Secondary | ICD-10-CM

## 2016-11-02 DIAGNOSIS — B349 Viral infection, unspecified: Secondary | ICD-10-CM

## 2016-11-02 MED ORDER — PREDNISONE 50 MG PO TABS: 50.0000 mg | ORAL_TABLET | Freq: Every day | ORAL | 0 refills | Status: DC

## 2016-11-02 MED ORDER — TRIAMCINOLONE ACETONIDE 55 MCG/ACT NA AERO: 2.0000 | INHALATION_SPRAY | Freq: Every day | NASAL | 0 refills | Status: DC

## 2016-11-02 NOTE — ED Provider Notes (Signed)
[place]    CSN: [id number] Arrival date & time: 11/02/16  1625     History   Chief Complaint Chief Complaint  Patient presents with  . Otalgia    HPI Susan Coleman is a 56 y.o. female. She presents today with a 3 to four-day history of sore throat, left earache, head congestion. Runny nose. Not much cough. Feels achy, some headache. No fever. Couple episodes of vomiting, little bit of diarrhea. Seen at the urgent care on 1/28, with otitis; treated with amoxicillin. Felt better.  HPI  Past Medical History:  Diagnosis Date  . No pertinent past medical history     Patient Active Problem List   Diagnosis Date Noted  . Nasal congestion 01/21/2016  . Bacterial vaginitis 01/21/2016  . Allergic rhinitis 12/20/2015  . Healthcare maintenance 12/20/2015  . Acute sinusitis 12/20/2015    Past Surgical History:  Procedure Laterality Date  . LAPAROSCOPIC ABDOMINAL EXPLORATION  1986    OB History    Gravida Para Term Preterm AB Living   2 2 2     2    SAB TAB Ectopic Multiple Live Births                   Home Medications   Takes no meds regularly    Family History Family History  Problem Relation Age of Onset  . Stroke Mother 85  . Cancer Mother     unsure what type  . High blood pressure Mother     Social History Social History  Substance Use Topics  . Smoking status: Current Some Day Smoker  . Smokeless tobacco: Not on file     Comment: 3 cigs per week  . Alcohol use 1.2 oz/week    2 Standard drinks or equivalent per week     Allergies   Patient has no known allergies.   Review of Systems Review of Systems  All other systems reviewed and are negative.    Physical Exam Triage Vital Signs ED Triage Vitals  Enc Vitals Group     BP 11/02/16 1657 112/78     Pulse Rate 11/02/16 1657 92     Resp 11/02/16 1657 16     Temp 11/02/16 1657 98.4 F (36.9 C)     Temp Source 11/02/16 1657 Oral     SpO2 11/02/16 1657 100 %     Weight --       Height --      Pain Score 11/02/16 1656 8   Updated Vital Signs BP 112/78 (BP Location: Right Arm)   Pulse 92   Temp 98.4 F (36.9 C) (Oral)   Resp 16   LMP 09/29/2011   SpO2 100%   Physical Exam  Constitutional: She is oriented to person, place, and time. No distress.  Alert, nicely groomed Laying down on exam table, able to sit up for exam  HENT:  Head: Atraumatic.  Bilateral TMs mildly dull, right is red tinged Moderate nasal congestion Prominent pink tonsils bilaterally, no exudate  Eyes:  Conjugate gaze, no eye redness/drainage  Neck: Neck supple.  Cardiovascular: Normal rate and regular rhythm.   Pulmonary/Chest: No respiratory distress. She has no wheezes. She has no rales.  Coarse but symmetric breath sounds throughout  Abdominal: She exhibits no distension.  Musculoskeletal: Normal range of motion.  No leg swelling  Neurological: She is alert and oriented to person, place, and time.  Skin: Skin is warm and dry.  No cyanosis  Nursing  note and vitals reviewed.    UC Treatments / Results   Procedures Procedures (including critical care time) None today  Final Clinical Impressions(s) / UC Diagnoses   Final diagnoses:  Congestion of nasal sinus  Bronchitis with bronchospasm  Nonspecific syndrome suggestive of viral illness   Recheck for increasing phlegm production/nasal drainage, new fever >100.5, or if not starting to improve in a few days.    New Prescriptions Discharge Medication List as of 11/02/2016  7:20 PM    START taking these medications   Details  predniSONE (DELTASONE) 50 MG tablet Take 1 tablet (50 mg total) by mouth daily., Starting Sun 11/02/2016, Normal    triamcinolone (NASACORT AQ) 55 MCG/ACT AERO nasal inhaler Place 2 sprays into the nose daily., Starting Sun 11/02/2016, Normal         Sherlene Shams, MD 11/03/16 1300

## 2016-11-02 NOTE — ED Triage Notes (Signed)
The patient presented to the [facility] with a complaint of bilateral ear pain and nausea x 2 days.

## 2016-11-02 NOTE — Discharge Instructions (Addendum)
Recheck for increasing phlegm production/nasal drainage, new fever >100.5, or if not starting to improve in a few days.

## 2017-06-24 ENCOUNTER — Encounter (HOSPITAL_COMMUNITY): Payer: Self-pay | Admitting: *Deleted

## 2017-06-24 ENCOUNTER — Inpatient Hospital Stay (HOSPITAL_COMMUNITY)
Admission: AD | Admit: 2017-06-24 | Discharge: 2017-06-24 | Disposition: A | Discharge status: Home / Self Care | Payer: 59 | Source: Ambulatory Visit | Attending: Obstetrics & Gynecology | Admitting: Obstetrics & Gynecology

## 2017-06-24 DIAGNOSIS — F17200 Nicotine dependence, unspecified, uncomplicated: Secondary | ICD-10-CM | POA: Insufficient documentation

## 2017-06-24 DIAGNOSIS — Z780 Asymptomatic menopausal state: Secondary | ICD-10-CM | POA: Insufficient documentation

## 2017-06-24 DIAGNOSIS — K648 Other hemorrhoids: Secondary | ICD-10-CM | POA: Diagnosis not present

## 2017-06-24 DIAGNOSIS — K5900 Constipation, unspecified: Secondary | ICD-10-CM | POA: Insufficient documentation

## 2017-06-24 DIAGNOSIS — K625 Hemorrhage of anus and rectum: Secondary | ICD-10-CM | POA: Diagnosis present

## 2017-06-24 LAB — URINALYSIS, ROUTINE W REFLEX MICROSCOPIC
BILIRUBIN URINE: NEGATIVE
GLUCOSE, UA: NEGATIVE mg/dL
Hgb urine dipstick: NEGATIVE
Ketones, ur: NEGATIVE mg/dL
Nitrite: NEGATIVE
PH: 6 (ref 5.0–8.0)
Protein, ur: NEGATIVE mg/dL
SPECIFIC GRAVITY, URINE: 1.017 (ref 1.005–1.030)

## 2017-06-24 LAB — CBC
HEMATOCRIT: 39.5 % (ref 36.0–46.0)
Hemoglobin: 13.5 g/dL (ref 12.0–15.0)
MCH: 32.8 pg (ref 26.0–34.0)
MCHC: 34.2 g/dL (ref 30.0–36.0)
MCV: 96.1 fL (ref 78.0–100.0)
PLATELETS: 251 10*3/uL (ref 150–400)
RBC: 4.11 MIL/uL (ref 3.87–5.11)
RDW: 14.1 % (ref 11.5–15.5)
WBC: 10.6 10*3/uL — ABNORMAL HIGH (ref 4.0–10.5)

## 2017-06-24 NOTE — Progress Notes (Signed)
Pt for DC. Went in rm but not here. Checked waiting area, pt not there. Receptionist stated pt left

## 2017-06-24 NOTE — MAU Provider Note (Signed)
History     CSN: [id number]  Arrival date and time: 06/24/17 1737   First Provider Initiated Contact with Patient 06/24/17 1937      Chief Complaint  Patient presents with  . Back Pain  . Rectal Bleeding  . Rectal Pain   56 y.o. Post-menopausal female here with rectal bleeding x2 mos. Sx occur mostly with BMs but sometimes while just sitting on the toilet. Admits to constipation and stool about 50% of the time. Uses Metamucil occasionally. Does not have PCP. Has not had colon cancer screening or any other current health maintenance screenings.   Past Medical History:  Diagnosis Date  . No pertinent past medical history     Past Surgical History:  Procedure Laterality Date  . LAPAROSCOPIC ABDOMINAL EXPLORATION  1986    Family History  Problem Relation Age of Onset  . Stroke Mother 51  . Cancer Mother        unsure what type  . High blood pressure Mother     Social History  Substance Use Topics  . Smoking status: Current Some Day Smoker  . Smokeless tobacco: Not on file     Comment: 3 cigs per week  . Alcohol use 1.2 oz/week    2 Standard drinks or equivalent per week    Allergies: No Known Allergies  Prescriptions Prior to Admission  Medication Sig Dispense Refill Last Dose  . predniSONE (DELTASONE) 50 MG tablet Take 1 tablet (50 mg total) by mouth daily. 3 tablet 0   . triamcinolone (NASACORT AQ) 55 MCG/ACT AERO nasal inhaler Place 2 sprays into the nose daily. 1 Inhaler 0     Review of Systems  Constitutional: Negative.   Gastrointestinal: Positive for anal bleeding and constipation. Negative for abdominal pain.   Physical Exam   Blood pressure 114/75, pulse 82, temperature 98.3 F (36.8 C), temperature source Oral, resp. rate 17, weight 120 lb 12 oz (54.8 kg), last menstrual period 09/29/2011, SpO2 100 %.  Physical Exam  Constitutional: She appears well-developed and well-nourished. No distress.  HENT:  Head: Normocephalic and atraumatic.  Neck:  Normal range of motion.  Cardiovascular: Normal rate.   Respiratory: Effort normal.  GI: Soft. She exhibits no distension and no mass. There is no tenderness. There is no rebound and no guarding.  Genitourinary: Rectal exam shows internal hemorrhoid (small x1). Rectal exam shows no external hemorrhoid, no fissure, no mass, no tenderness, anal tone normal and guaiac negative stool.   Results for orders placed or performed during the hospital encounter of 06/24/17 (from the past 24 hour(s))  Urinalysis, Routine w reflex microscopic     Status: Abnormal   Collection Time: 06/24/17  6:12 PM  Result Value Ref Range   Color, Urine YELLOW YELLOW   APPearance HAZY (A) CLEAR   Specific Gravity, Urine 1.017 1.005 - 1.030   pH 6.0 5.0 - 8.0   Glucose, UA NEGATIVE NEGATIVE mg/dL   Hgb urine dipstick NEGATIVE NEGATIVE   Bilirubin Urine NEGATIVE NEGATIVE   Ketones, ur NEGATIVE NEGATIVE mg/dL   Protein, ur NEGATIVE NEGATIVE mg/dL   Nitrite NEGATIVE NEGATIVE   Leukocytes, UA SMALL (A) NEGATIVE   RBC / HPF 0-5 0 - 5 RBC/hpf   WBC, UA 0-5 0 - 5 WBC/hpf   Bacteria, UA RARE (A) NONE SEEN   Squamous Epithelial / LPF 6-30 (A) NONE SEEN   Mucus PRESENT   CBC     Status: Abnormal   Collection Time: 06/24/17  7:49 PM  Result Value Ref Range   WBC 10.6 (H) 4.0 - 10.5 K/uL   RBC 4.11 3.87 - 5.11 MIL/uL   Hemoglobin 13.5 12.0 - 15.0 g/dL   HCT 39.5 36.0 - 46.0 %   MCV 96.1 78.0 - 100.0 fL   MCH 32.8 26.0 - 34.0 pg   MCHC 34.2 30.0 - 36.0 g/dL   RDW 14.1 11.5 - 15.5 %   Platelets 251 150 - 400 K/uL   MAU Course  Procedures  MDM Labs ordered and reviewed. Likely bleeding internal hemorrhoid. Recommend establishing care with PCP and further workup for rectal bleeding. Pt will call for list of PCPs covered by her insurance and make appt.  Assessment and Plan   1. Internal hemorrhoid   2. Constipation, unspecified constipation type    Discharge home Follow up with PCP asap- establish care, needs  rectal bleed work-up Metamucil daily Increase dietary fiber and water  Allergies as of 06/24/2017   No Known Allergies     Medication List    TAKE these medications   predniSONE 50 MG tablet Commonly known as:  DELTASONE Take 1 tablet (50 mg total) by mouth daily.   triamcinolone 55 MCG/ACT Aero nasal inhaler Commonly known as:  NASACORT AQ Place 2 sprays into the nose daily.      Julianne Handler, CNM 06/24/2017, 8:04 PM

## 2017-06-24 NOTE — MAU Note (Signed)
Bleeding when she wipes after a BM

## 2017-06-24 NOTE — Discharge Instructions (Signed)
Hemorrhoids Hemorrhoids are swollen veins in and around the rectum or anus. Hemorrhoids can cause pain, itching, or bleeding. Most of the time, they do not cause serious problems. They usually get better with diet changes, lifestyle changes, and other home treatments. Follow these instructions at home: Eating and drinking  Eat foods that have fiber, such as whole grains, beans, nuts, fruits, and vegetables. Ask your doctor about taking products that have added fiber (fibersupplements).  Drink enough fluid to keep your pee (urine) clear or pale yellow. For Pain and Swelling  Take a warm-water bath (sitz bath) for 20 minutes to ease pain. Do this 3-4 times a day.  If directed, put ice on the painful area. It may be helpful to use ice between your warm baths. ? Put ice in a plastic bag. ? Place a towel between your skin and the bag. ? Leave the ice on for 20 minutes, 2-3 times a day. General instructions  Take over-the-counter and prescription medicines only as told by your doctor. ? Medicated creams and medicines that are inserted into the anus (suppositories) may be used or applied as told.  Exercise often.  Go to the bathroom when you have the urge to poop (to have a bowel movement). Do not wait.  Avoid pushing too hard (straining) when you poop.  Keep the butt area dry and clean. Use wet toilet paper or moist paper towels.  Do not sit on the toilet for a long time. Contact a doctor if:  You have any of these: ? Pain and swelling that do not get better with treatment or medicine. ? Bleeding that will not stop. ? Trouble pooping or you cannot poop. ? Pain or swelling outside the area of the hemorrhoids. This information is not intended to replace advice given to you by your health care provider. Make sure you discuss any questions you have with your health care provider. Document Released: 06/17/2008 Document Revised: 02/14/2016 Document Reviewed: 05/23/2015 Elsevier  Interactive Patient Education  2018 [employer].  Constipation, Adult Constipation is when a person: Poops (has a bowel movement) fewer times in a week than normal. Has a hard time pooping. Has poop that is dry, hard, or bigger than normal.  Follow these instructions at home: Eating and drinking  Eat foods that have a lot of fiber, such as: Fresh fruits and vegetables. Whole grains. Beans. Eat less of foods that are high in fat, low in fiber, or overly processed, such as: [place] fries. Hamburgers. Cookies. Candy. Soda. Drink enough fluid to keep your pee (urine) clear or pale yellow. General instructions Exercise regularly or as told by your doctor. Go to the restroom when you feel like you need to poop. Do not hold it in. Take over-the-counter and prescription medicines only as told by your doctor. These include any fiber supplements. Do pelvic floor retraining exercises, such as: Doing deep breathing while relaxing your lower belly (abdomen). Relaxing your pelvic floor while pooping. Watch your condition for any changes. Keep all follow-up visits as told by your doctor. This is important. Contact a doctor if: You have pain that gets worse. You have a fever. You have not pooped for 4 days. You throw up (vomit). You are not hungry. You lose weight. You are bleeding from the anus. You have thin, pencil-like poop (stool). Get help right away if: You have a fever, and your symptoms suddenly get worse. You leak poop or have blood in your poop. Your belly feels hard or bigger  than normal (is bloated). You have very bad belly pain. You feel dizzy or you faint. This information is not intended to replace advice given to you by your health care provider. Make sure you discuss any questions you have with your health care provider. Document Released: 02/25/2008 Document Revised: 03/28/2016 Document Reviewed: 02/27/2016 Elsevier Interactive Patient Education  2017 [employer].

## 2017-06-24 NOTE — MAU Note (Signed)
Been bleeding from rectum.  Been going on for a while- all summer.  Every time she uses the bathroom and even when she doesn't use the bathroom.  There is blood. Having low back ache.  Pain when she uses the bathroom

## 2017-09-22 HISTORY — PX: NASAL SINUS SURGERY: SHX719

## 2017-10-12 DIAGNOSIS — K625 Hemorrhage of anus and rectum: Secondary | ICD-10-CM | POA: Diagnosis not present

## 2017-12-08 ENCOUNTER — Encounter: Payer: Self-pay | Admitting: Obstetrics & Gynecology

## 2017-12-11 DIAGNOSIS — R0981 Nasal congestion: Secondary | ICD-10-CM | POA: Diagnosis not present

## 2017-12-11 DIAGNOSIS — J019 Acute sinusitis, unspecified: Secondary | ICD-10-CM | POA: Diagnosis not present

## 2017-12-11 DIAGNOSIS — R0982 Postnasal drip: Secondary | ICD-10-CM | POA: Diagnosis not present

## 2017-12-30 DIAGNOSIS — R0981 Nasal congestion: Secondary | ICD-10-CM | POA: Diagnosis not present

## 2017-12-30 DIAGNOSIS — R0982 Postnasal drip: Secondary | ICD-10-CM | POA: Diagnosis not present

## 2017-12-30 DIAGNOSIS — J019 Acute sinusitis, unspecified: Secondary | ICD-10-CM | POA: Diagnosis not present

## 2017-12-30 DIAGNOSIS — R51 Headache: Secondary | ICD-10-CM | POA: Diagnosis not present

## 2017-12-30 DIAGNOSIS — Z79899 Other long term (current) drug therapy: Secondary | ICD-10-CM | POA: Diagnosis not present

## 2017-12-30 DIAGNOSIS — R590 Localized enlarged lymph nodes: Secondary | ICD-10-CM | POA: Diagnosis not present

## 2018-05-19 DIAGNOSIS — J029 Acute pharyngitis, unspecified: Secondary | ICD-10-CM | POA: Diagnosis not present

## 2018-05-19 DIAGNOSIS — H10013 Acute follicular conjunctivitis, bilateral: Secondary | ICD-10-CM | POA: Diagnosis not present

## 2018-05-19 DIAGNOSIS — H01005 Unspecified blepharitis left lower eyelid: Secondary | ICD-10-CM | POA: Diagnosis not present

## 2018-06-17 ENCOUNTER — Telehealth: Payer: Self-pay | Admitting: Family Medicine

## 2018-06-17 NOTE — Telephone Encounter (Signed)
Left general message for pt regarding health maintenance check up appt.

## 2018-07-12 ENCOUNTER — Ambulatory Visit (INDEPENDENT_AMBULATORY_CARE_PROVIDER_SITE_OTHER): Payer: 59 | Admitting: Family Medicine

## 2018-07-12 ENCOUNTER — Other Ambulatory Visit: Payer: Self-pay

## 2018-07-12 VITALS — BP 92/62 | HR 87 | Temp 98.6°F | Ht 64.0 in | Wt 122.8 lb

## 2018-07-12 DIAGNOSIS — Z23 Encounter for immunization: Secondary | ICD-10-CM | POA: Diagnosis not present

## 2018-07-12 DIAGNOSIS — J069 Acute upper respiratory infection, unspecified: Secondary | ICD-10-CM

## 2018-07-12 MED ORDER — AZITHROMYCIN 250 MG PO TABS: ORAL_TABLET | ORAL | 0 refills | Status: DC

## 2018-07-12 NOTE — Progress Notes (Signed)
   Subjective:   Patient ID: HAIDEN CLUCAS    DOB: 1961/02/08, 57 y.o. female   MRN: [id number]  CC: Cough, fever  HPI: MERILYN PAGAN is a 57 y.o. female who presents to clinic today for the following issue.  Cough, fever Patient reports symptoms started a few weeks ago and have been worsening over the last 1 week.  She describes lots of mucus, cough is productive of thick yellow sputum.  She has also been having body aches. Fever a few days ago to 102F, took a Tylenol which brought it down.  She does not have a fever today and wants a flu shot.  No nausea or vomiting.  She has been eating less but is snacking and drinking water and other fluids.  She has tried Zyrtec and Mucinex which have not been helping.  No abdominal pain or diarrhea.  Her grandchildren have been sick with similar symptoms.  ROS: No fever, chills, nausea, vomiting.  No abdominal pain or diarrhea.  Social: Patient is a current some day smoker. Medications reviewed. Objective:   BP 92/62   Pulse 87   Temp 98.6 F (37 C) (Oral)   Ht 5\' 4"  (1.626 m)   Wt 122 lb 12.8 oz (55.7 kg)   LMP 09/29/2011   SpO2 99%   BMI 21.08 kg/m  Vitals and nursing note reviewed.  General: Pleasant 57 year old female, NAD HEENT: NCAT, EOMI, PERRL, clear rhinorrhea, MMM, oropharynx clear without tonsillar erythema or exudate Neck: supple, non-tender, normal ROM, no LAD  CV: RRR no MRG  Lungs: CTAB, normal effort, no wheeze Abdomen: soft, NTND, +bs  Skin: warm, dry, no rash Extremities: warm and well perfused, normal tone  Assessment & Plan:   Upper respiratory tract infection Likely viral, however given duration and severity of symptoms will trial a course of antibiotics.  Anticipate improvement over the next several days. -Rx: azithromycin  -Advised plenty of warm fluids -Tylenol prn for fever and pain -Return precautions discussed  Health Maintenance:  Flu shot today  Orders Placed This Encounter  Procedures  . Flu  Vaccine QUAD 36+ mos IM   Meds ordered this encounter  Medications  . azithromycin (ZITHROMAX) 250 MG tablet    Sig: Take 2 tabs on day 1, followed by 1 tablet daily.    Dispense:  6 each    Refill:  0   Follow-up: As needed  Lovenia Kim, MD [place] PGY-3

## 2018-07-12 NOTE — Patient Instructions (Signed)
It was nice meeting you today.  You were seen in clinic for sinus congestion and cough.  As we discussed, your symptoms are most likely viral, however given your duration and severity of symptoms, I am prescribing you a course of antibiotics.  You may pick these up from your pharmacy and take 2 tablets today, followed by 1 tablet daily.   I would advise drinking plenty of water and warm fluids to stay hydrated.  Additionally, you may take Tylenol as needed for pain and fever.  If your symptoms worsen or do not improve, please make an appointment to be seen by a provider. I hope you start to feel better soon!  Lovenia Kim MD

## 2018-08-31 DIAGNOSIS — M5431 Sciatica, right side: Secondary | ICD-10-CM | POA: Diagnosis not present

## 2018-08-31 DIAGNOSIS — N951 Menopausal and female climacteric states: Secondary | ICD-10-CM | POA: Diagnosis not present

## 2018-08-31 DIAGNOSIS — Z0000 Encounter for general adult medical examination without abnormal findings: Secondary | ICD-10-CM | POA: Diagnosis not present

## 2018-08-31 DIAGNOSIS — R0981 Nasal congestion: Secondary | ICD-10-CM | POA: Diagnosis not present

## 2018-09-14 DIAGNOSIS — Z01419 Encounter for gynecological examination (general) (routine) without abnormal findings: Secondary | ICD-10-CM | POA: Diagnosis not present

## 2018-09-14 DIAGNOSIS — K640 First degree hemorrhoids: Secondary | ICD-10-CM | POA: Diagnosis not present

## 2018-09-14 DIAGNOSIS — J330 Polyp of nasal cavity: Secondary | ICD-10-CM | POA: Diagnosis not present

## 2018-09-14 DIAGNOSIS — Z124 Encounter for screening for malignant neoplasm of cervix: Secondary | ICD-10-CM | POA: Diagnosis not present

## 2018-09-14 DIAGNOSIS — K5900 Constipation, unspecified: Secondary | ICD-10-CM | POA: Diagnosis not present

## 2018-10-19 DIAGNOSIS — H9319 Tinnitus, unspecified ear: Secondary | ICD-10-CM | POA: Diagnosis not present

## 2018-10-19 DIAGNOSIS — R0981 Nasal congestion: Secondary | ICD-10-CM | POA: Diagnosis not present

## 2018-10-20 DIAGNOSIS — H9313 Tinnitus, bilateral: Secondary | ICD-10-CM | POA: Diagnosis not present

## 2018-10-20 DIAGNOSIS — H9193 Unspecified hearing loss, bilateral: Secondary | ICD-10-CM | POA: Diagnosis not present

## 2018-11-26 ENCOUNTER — Ambulatory Visit
Admission: RE | Admit: 2018-11-26 | Discharge: 2018-11-26 | Disposition: A | Discharge status: Home / Self Care | Payer: 59 | Source: Ambulatory Visit | Attending: Nurse Practitioner | Admitting: Nurse Practitioner

## 2018-11-26 ENCOUNTER — Other Ambulatory Visit: Payer: Self-pay | Admitting: Nurse Practitioner

## 2018-11-26 DIAGNOSIS — Z1231 Encounter for screening mammogram for malignant neoplasm of breast: Secondary | ICD-10-CM | POA: Diagnosis not present

## 2019-03-01 ENCOUNTER — Encounter: Payer: Self-pay | Admitting: Physician Assistant

## 2019-03-04 ENCOUNTER — Telehealth: Payer: Self-pay | Admitting: Physician Assistant

## 2019-03-04 NOTE — Telephone Encounter (Signed)
Pt informed that Susan Coleman referred her from [facility].

## 2019-03-07 ENCOUNTER — Encounter (INDEPENDENT_AMBULATORY_CARE_PROVIDER_SITE_OTHER): Payer: Self-pay

## 2019-03-07 ENCOUNTER — Ambulatory Visit (INDEPENDENT_AMBULATORY_CARE_PROVIDER_SITE_OTHER): Payer: 59 | Admitting: Physician Assistant

## 2019-03-07 ENCOUNTER — Other Ambulatory Visit: Payer: Self-pay

## 2019-03-07 ENCOUNTER — Encounter: Payer: Self-pay | Admitting: Physician Assistant

## 2019-03-07 VITALS — BP 104/76 | HR 89 | Temp 97.8°F | Ht 62.0 in | Wt 121.0 lb

## 2019-03-07 DIAGNOSIS — K649 Unspecified hemorrhoids: Secondary | ICD-10-CM | POA: Diagnosis not present

## 2019-03-07 DIAGNOSIS — K5909 Other constipation: Secondary | ICD-10-CM

## 2019-03-07 DIAGNOSIS — K625 Hemorrhage of anus and rectum: Secondary | ICD-10-CM | POA: Diagnosis not present

## 2019-03-07 MED ORDER — HYDROCORTISONE ACETATE 25 MG RE SUPP: 25.0000 mg | Freq: Two times a day (BID) | RECTAL | 2 refills | Status: DC

## 2019-03-07 MED ORDER — NA SULFATE-K SULFATE-MG SULF 17.5-3.13-1.6 GM/177ML PO SOLN: ORAL | 0 refills | Status: DC

## 2019-03-07 NOTE — Patient Instructions (Addendum)
If you are age 58 or older, your body mass index should be between 23-30. Your Body mass index is 22.13 kg/m. If this is out of the aforementioned range listed, please consider follow up with your Primary Care Provider.  If you are age 65 or younger, your body mass index should be between 19-25. Your Body mass index is 22.13 kg/m. If this is out of the aformentioned range listed, please consider follow up with your Primary Care Provider.   You have been scheduled for a colonoscopy. Please follow written instructions given to you at your visit today.  Please pick up your prep supplies at the pharmacy within the next 1-3 days. If you use inhalers (even only as needed), please bring them with you on the day of your procedure. Your physician has requested that you go to www.startemmi.com and enter the access code given to you at your visit today. This web site gives a general overview about your procedure. However, you should still follow specific instructions given to you by our office regarding your preparation for the procedure.  We have sent the following medications to your pharmacy for you to pick up at your convenience: Suprep Hydrocortisone Suppositories  Start Miralax 17 gm daily in water. Start Benefiber daily in water. INCREASE water and fiber intake.  Thank you for choosing me and [facility].  Amy Esterwood, PA-C

## 2019-03-07 NOTE — Progress Notes (Signed)
Subjective:    Patient ID: Susan Coleman, female    DOB: 1961/07/12, 58 y.o.   MRN: [id number]  HPI Susan Coleman is a pleasant 58 year old African-American female, new to GI today referred by [facility] NP for evaluation of rectal bleeding. Patient has not had any prior GI evaluation.  She is generally been in good health. She says she has had her current symptoms over the past year intermittently.  Generally when she starts seeing blood she may see blood on a daily basis for a week or so at a time.  She says the blood is always bright red and associated with a bowel movement or occasionally with passage of flatus.  Feel that straining for bowel movements worsens her symptoms.  She denies any abdominal pain, will occasionally get some low back discomfort with straining for bowel movements.  She has no complaints of rectal pain but says at times she gets swelling from which she feels her hemorrhoids makes it uncomfortable to sit.  She has not been on any regular laxative regimen but usually goes 3 to 4 days between bowel movements will have hard stools. She was examined by primary care about 2 weeks ago noted to have hemorrhoids and started on hydrocortisone suppositories twice daily.  She thinks this has been helpful. Family history is negative for colon cancer and polyps as far she is aware.  Review of Systems Pertinent positive and negative review of systems were noted in the above HPI section.  All other review of systems was otherwise negative.  Outpatient Encounter Medications as of 03/07/2019  Medication Sig  . azithromycin (ZITHROMAX) 250 MG tablet Take 2 tabs on day 1, followed by 1 tablet daily.  . predniSONE (DELTASONE) 50 MG tablet Take 1 tablet (50 mg total) by mouth daily.  Marland Kitchen triamcinolone (NASACORT AQ) 55 MCG/ACT AERO nasal inhaler Place 2 sprays into the nose daily.  . hydrocortisone (ANUSOL-HC) 25 MG suppository Place 1 suppository (25 mg total) rectally 2 (two) times  daily.  . Na Sulfate-K Sulfate-Mg Sulf 17.5-3.13-1.6 GM/177ML SOLN Suprep-Use as directed   No facility-administered encounter medications on file as of 03/07/2019.    No Known Allergies Patient Active Problem List   Diagnosis Date Noted  . Nasal congestion 01/21/2016  . Bacterial vaginitis 01/21/2016  . Allergic rhinitis 12/20/2015  . Healthcare maintenance 12/20/2015  . Acute sinusitis 12/20/2015   Social History   Socioeconomic History  . Marital status: Married    Spouse name: Not on file  . Number of children: 2  . Years of education: Not on file  . Highest education level: Not on file  Occupational History  . Occupation: Actuary  Social Needs  . Financial resource strain: Not on file  . Food insecurity    Worry: Not on file    Inability: Not on file  . Transportation needs    Medical: Not on file    Non-medical: Not on file  Tobacco Use  . Smoking status: Current Some Day Smoker  . Smokeless tobacco: Never Used  . Tobacco comment: 3 cigs per week  Substance and Sexual Activity  . Alcohol use: Yes    Alcohol/week: 2.0 standard drinks    Types: 2 Standard drinks or equivalent per week  . Drug use: No  . Sexual activity: Yes    Partners: Male  Lifestyle  . Physical activity    Days per week: Not on file    Minutes per session: Not on file  . Stress:  Not on file  Relationships  . Social Herbalist on phone: Not on file    Gets together: Not on file    Attends religious service: Not on file    Active member of club or organization: Not on file    Attends meetings of clubs or organizations: Not on file    Relationship status: Not on file  . Intimate partner violence    Fear of current or ex partner: Not on file    Emotionally abused: Not on file    Physically abused: Not on file    Forced sexual activity: Not on file  Other Topics Concern  . Not on file  Social History Narrative  . Not on file    Ms. Groner family history includes  Breast cancer in her half-sister; Cancer in her mother; High blood pressure in her mother; Stroke (age of onset: 40) in her mother.      Objective:    Vitals:   03/07/19 0926  BP: 104/76  Pulse: 89  Temp: 97.8 F (36.6 C)    Physical Exam; well-developed older African-American female in no acute distress, pleasant.  BMI 22.1.  HEENT ;nontraumatic normocephalic EOMI PERRLA sclera anicteric, oropharynx not examined/patient wearing mass/COVID-19, neck; supple.  Cardiovascular; regular rate and rhythm with S1-S2 no murmur rub or gallop.  Pulmonary ;clear bilaterally, Abdomen ;soft, nontender nondistended bowel sounds are active there is no palpable mass or hepatosplenomegaly, rectal exam not done today.  Extremities ;no clubbing cyanosis or edema skin warm dry, Neuropsych; alert and oriented, grossly nonfocal mood and affect appropriate       Assessment & Plan:   #27 58 year old African-American female with 1 year history of intermittent rectal bleeding with bright red blood, generally worsened with constipation.  Documented to have hemorrhoids per PCP.  Rectal bleeding rule out secondary to internal or external hemorrhoids, versus occult colon lesion.  Plan; patient will be scheduled for colonoscopy with Dr. Silverio Decamp.  Procedure was discussed in detail with the patient including indications risks and benefits and she is agreeable to proceed.  We will continue hydrocortisone suppositories asked her to complete 2 weeks at twice daily and then may decrease to nightly.   refill sent. We also discussed possible in office hemorrhoidal banding for eradication of internal hemorrhoids if that is found to be the source of her rectal bleeding.  She would be very interested in pursuing this. Patient encouraged to increase water intake to 60 ounces per day, add MiraLAX 17 g in 8 ounces of water daily and add  Benefiber 1 scoop per day.     S  PA-C 03/07/2019   Cc: No ref. provider  found

## 2019-03-18 ENCOUNTER — Telehealth: Payer: Self-pay | Admitting: Gastroenterology

## 2019-03-18 NOTE — Telephone Encounter (Signed)

## 2019-03-21 ENCOUNTER — Ambulatory Visit (AMBULATORY_SURGERY_CENTER): Payer: 59 | Admitting: Gastroenterology

## 2019-03-21 ENCOUNTER — Encounter: Payer: Self-pay | Admitting: Gastroenterology

## 2019-03-21 ENCOUNTER — Other Ambulatory Visit: Payer: Self-pay

## 2019-03-21 VITALS — BP 119/74 | HR 69 | Temp 98.8°F | Resp 15 | Ht 62.0 in | Wt 121.0 lb

## 2019-03-21 DIAGNOSIS — K635 Polyp of colon: Secondary | ICD-10-CM

## 2019-03-21 DIAGNOSIS — K625 Hemorrhage of anus and rectum: Secondary | ICD-10-CM

## 2019-03-21 DIAGNOSIS — K629 Disease of anus and rectum, unspecified: Secondary | ICD-10-CM

## 2019-03-21 DIAGNOSIS — D125 Benign neoplasm of sigmoid colon: Secondary | ICD-10-CM

## 2019-03-21 MED ORDER — SODIUM CHLORIDE 0.9 % IV SOLN: 500.0000 mL | Freq: Once | INTRAVENOUS | Status: DC

## 2019-03-21 NOTE — Progress Notes (Signed)
Called to room to assist during endoscopic procedure.  Patient ID and intended procedure confirmed with present staff. Received instructions for my participation in the procedure from the performing physician.  

## 2019-03-21 NOTE — Progress Notes (Signed)
A/ox3, pleased with MAC, report to RN 

## 2019-03-21 NOTE — Op Note (Signed)
[facility] Patient Name: Susan Coleman Procedure Date: 03/21/2019 3:06 PM MRN: [id number] Endoscopist: Mauri Pole , MD Age: 58 Referring MD:  Date of Birth: 20-Sep-1961 Gender: Female Account #: [id number] Procedure:                Colonoscopy Indications:              Evaluation of unexplained GI bleeding presenting                            with Hematochezia Medicines:                Monitored Anesthesia Care Procedure:                Pre-Anesthesia Assessment:                           - Prior to the procedure, a History and Physical                            was performed, and patient medications and                            allergies were reviewed. The patient's tolerance of                            previous anesthesia was also reviewed. The risks                            and benefits of the procedure and the sedation                            options and risks were discussed with the patient.                            All questions were answered, and informed consent                            was obtained. Prior Anticoagulants: The patient has                            taken no previous anticoagulant or antiplatelet                            agents. ASA Grade Assessment: II - A patient with                            mild systemic disease. After reviewing the risks                            and benefits, the patient was deemed in                            satisfactory condition to undergo the procedure.  After obtaining informed consent, the colonoscope                            was passed under direct vision. Throughout the                            procedure, the patient's blood pressure, pulse, and                            oxygen saturations were monitored continuously. The                            Colonoscope was introduced through the anus and                            advanced to the the cecum, identified by                             appendiceal orifice and ileocecal valve. The                            colonoscopy was performed without difficulty. The                            patient tolerated the procedure well. The quality                            of the bowel preparation was good. The ileocecal                            valve, appendiceal orifice, and rectum were                            photographed. Scope In: 3:12:37 PM Scope Out: 3:33:04 PM Scope Withdrawal Time: 0 hours 15 minutes 41 seconds  Total Procedure Duration: 0 hours 20 minutes 27 seconds  Findings:                 The perianal and digital rectal examinations were                            normal.                           A 8 mm polyp was found in the sigmoid colon. The                            polyp was sessile. The polyp was removed with a                            cold snare. Resection and retrieval were complete.                           Multiple small-mouthed diverticula were found in  the sigmoid colon and descending colon.                           Non-bleeding internal hemorrhoids were found during                            retroflexion. The hemorrhoids were medium-sized.                           A 6-7 mm polypoid lesion was found at the anal                            verge extending into the distal rectum. The lesion                            was flat with mucosal modularity. No bleeding was                            present. Biopsies were taken with a cold forceps                            for histology. Complications:            No immediate complications. Estimated Blood Loss:     Estimated blood loss was minimal. Impression:               - One 8 mm polyp in the sigmoid colon, removed with                            a cold snare. Resected and retrieved.                           - Diverticulosis in the sigmoid colon and in the                            descending  colon.                           - Non-bleeding internal hemorrhoids.                           - Rule out malignancy, polypoid lesion at the anus                            and in the distal rectum. Biopsied. Recommendation:           - Patient has a contact number available for                            emergencies. The signs and symptoms of potential                            delayed complications were discussed with the  patient. Return to normal activities tomorrow.                            Written discharge instructions were provided to the                            patient.                           - Resume previous diet.                           - Continue present medications.                           - Await pathology results.                           - Repeat colonoscopy date to be determined after                            pending pathology results are reviewed for                            surveillance based on pathology results.                           - Return to GI clinic at the next available                            appointment. Mauri Pole, MD 03/21/2019 3:41:14 PM This report has been signed electronically.

## 2019-03-21 NOTE — Telephone Encounter (Signed)
Pt responded "no" to all screening questions °

## 2019-03-21 NOTE — Progress Notes (Signed)
History reviewed today  Temp BH, VS MO

## 2019-03-21 NOTE — Patient Instructions (Signed)
YOU HAD AN ENDOSCOPIC PROCEDURE TODAY AT THE [facility]:   Refer to the procedure report that was given to you for any specific questions about what was found during the examination.  If the procedure report does not answer your questions, please call your gastroenterologist to clarify.  If you requested that your care partner not be given the details of your procedure findings, then the procedure report has been included in a sealed envelope for you to review at your convenience later.  YOU SHOULD EXPECT: Some feelings of bloating in the abdomen. Passage of more gas than usual.  Walking can help get rid of the air that was put into your GI tract during the procedure and reduce the bloating. If you had a lower endoscopy (such as a colonoscopy or flexible sigmoidoscopy) you may notice spotting of blood in your stool or on the toilet paper. If you underwent a bowel prep for your procedure, you may not have a normal bowel movement for a few days.  Please Note:  You might notice some irritation and congestion in your nose or some drainage.  This is from the oxygen used during your procedure.  There is no need for concern and it should clear up in a day or so.  SYMPTOMS TO REPORT IMMEDIATELY:   Following lower endoscopy (colonoscopy or flexible sigmoidoscopy):  Excessive amounts of blood in the stool  Significant tenderness or worsening of abdominal pains  Swelling of the abdomen that is new, acute  Fever of 100F or higher  For urgent or emergent issues, a gastroenterologist can be reached at any hour by calling [phone].   DIET:  We do recommend a small meal at first, but then you may proceed to your regular diet.  Drink plenty of fluids but you should avoid alcoholic beverages for 24 hours.  ACTIVITY:  You should plan to take it easy for the rest of today and you should NOT DRIVE or use heavy machinery until tomorrow (because of the sedation medicines used during the test).     FOLLOW UP: Our staff will call the number listed on your records 48-72 hours following your procedure to check on you and address any questions or concerns that you may have regarding the information given to you following your procedure. If we do not reach you, we will leave a message.  We will attempt to reach you two times.  During this call, we will ask if you have developed any symptoms of COVID 19. If you develop any symptoms (ie: fever, flu-like symptoms, shortness of breath, cough etc.) before then, please call [phone].  If you test positive for Covid 19 in the 2 weeks post procedure, please call and report this information to us.    If any biopsies were taken you will be contacted by phone or by letter within the next 1-3 weeks.  Please call us at [phone] if you have not heard about the biopsies in 3 weeks.    SIGNATURES/CONFIDENTIALITY: You and/or your care partner have signed paperwork which will be entered into your electronic medical record.  These signatures attest to the fact that that the information above on your After Visit Summary has been reviewed and is understood.  Full responsibility of the confidentiality of this discharge information lies with you and/or your care-partner. 

## 2019-03-23 ENCOUNTER — Telehealth: Payer: Self-pay | Admitting: *Deleted

## 2019-03-23 ENCOUNTER — Telehealth: Payer: Self-pay

## 2019-03-23 NOTE — Telephone Encounter (Signed)
Second post procedure follow up call, no answer 

## 2019-03-23 NOTE — Telephone Encounter (Signed)
No answer for first procedure call back. Left message and will call back. SM 

## 2019-04-01 ENCOUNTER — Encounter: Payer: Self-pay | Admitting: Gastroenterology

## 2019-04-14 NOTE — Progress Notes (Signed)
Reviewed and agree with documentation and assessment and plan. K. Veena Tricia Pledger , MD   

## 2019-04-18 ENCOUNTER — Telehealth: Payer: Self-pay

## 2019-04-18 NOTE — Telephone Encounter (Signed)
Covid-19 screening questions   Do you now or have you had a fever in the last 14 days? No  Do you have any respiratory symptoms of shortness of breath or cough now or in the last 14 days? No  Do you have any family members or close contacts with diagnosed or suspected Covid-19 in the past 14 days? No  Have you been tested for Covid-19 and found to be positive? No        

## 2019-04-19 ENCOUNTER — Ambulatory Visit (INDEPENDENT_AMBULATORY_CARE_PROVIDER_SITE_OTHER): Payer: 59 | Admitting: Gastroenterology

## 2019-04-19 ENCOUNTER — Encounter: Payer: Self-pay | Admitting: Gastroenterology

## 2019-04-19 VITALS — BP 130/70 | HR 103 | Temp 99.0°F | Ht 62.0 in | Wt 123.0 lb

## 2019-04-19 DIAGNOSIS — K642 Third degree hemorrhoids: Secondary | ICD-10-CM

## 2019-04-19 DIAGNOSIS — K623 Rectal prolapse: Secondary | ICD-10-CM

## 2019-04-19 DIAGNOSIS — K625 Hemorrhage of anus and rectum: Secondary | ICD-10-CM | POA: Diagnosis not present

## 2019-04-19 MED ORDER — HYDROCORTISONE ACETATE 25 MG RE SUPP: 25.0000 mg | Freq: Every day | RECTAL | 2 refills | Status: DC

## 2019-04-19 NOTE — Patient Instructions (Addendum)
We will refer you to [facility] to see Geryl Rankins , They will contact you with that appointment  Take Fiber Choice/ Benefiber 1 tablet twice daily  Use Anusol suppositories at bedtime as needed   Kegel Exercises  Kegel exercises can help strengthen your pelvic floor muscles. The pelvic floor is a group of muscles that support your rectum, small intestine, and bladder. In females, pelvic floor muscles also help support the womb (uterus). These muscles help you control the flow of urine and stool. Kegel exercises are painless and simple, and they do not require any equipment. Your provider may suggest Kegel exercises to:  Improve bladder and bowel control.  Improve sexual response.  Improve weak pelvic floor muscles after surgery to remove the uterus (hysterectomy) or pregnancy (females).  Improve weak pelvic floor muscles after prostate gland removal or surgery (males). Kegel exercises involve squeezing your pelvic floor muscles, which are the same muscles you squeeze when you try to stop the flow of urine or keep from passing gas. The exercises can be done while sitting, standing, or lying down, but it is best to vary your position. Exercises How to do Kegel exercises: 1. Squeeze your pelvic floor muscles tight. You should feel a tight lift in your rectal area. If you are a female, you should also feel a tightness in your vaginal area. Keep your stomach, buttocks, and legs relaxed. 2. Hold the muscles tight for up to 10 seconds. 3. Breathe normally. 4. Relax your muscles. 5. Repeat as told by your health care provider. Repeat this exercise daily as told by your health care provider. Continue to do this exercise for at least 4-6 weeks, or for as long as told by your health care provider. You may be referred to a physical therapist who can help you learn more about how to do Kegel exercises. Depending on your condition, your health care provider may  recommend:  Varying how long you squeeze your muscles.  Doing several sets of exercises every day.  Doing exercises for several weeks.  Making Kegel exercises a part of your regular exercise routine. This information is not intended to replace advice given to you by your health care provider. Make sure you discuss any questions you have with your health care provider. Document Released: 08/25/2012 Document Revised: 04/28/2018 Document Reviewed: 04/28/2018 Elsevier Patient Education  [place].      I appreciate the  opportunity to care for you  Thank You   Harl Bowie , MD

## 2019-04-19 NOTE — Progress Notes (Signed)
Susan Coleman    [id number]    31-Oct-1960  Primary Care Physician:Anderson, Claris Gladden, FNP  Referring Physician: Vonna Drafts, [address] [place],  [place] [address]   Chief complaint: Rectal bleeding HPI:  58 yr F here for follow up visit. She continues to have persistent bright red blood per rectum after bowel movement when she wipes.  It had improved with Anusol suppositories but recurred in the past week. Taking daily MiraLAX, denies excessive straining or constipation.  Colonoscopy 03/21/2019 8 mm sigmoid polyp removed, hyperplastic, diverticulosis and internal hemorrhoids.  Granular and slightly nodular rectal mucosa biopsied, was consistent with prolapsed mucosa  Outpatient Encounter Medications as of 04/19/2019  Medication Sig  . Cyanocobalamin (VITAMIN B 12 PO) Take 1 tablet by mouth daily.  Marland Kitchen levocetirizine (XYZAL) 5 MG tablet Take 5 mg by mouth every evening.  . Multiple Vitamins-Minerals (ALIVE WOMENS 50+) CHEW Chew by mouth. 2 gummies daily  . triamcinolone (NASACORT AQ) 55 MCG/ACT AERO nasal inhaler Place 2 sprays into the nose daily.  . [DISCONTINUED] azithromycin (ZITHROMAX) 250 MG tablet Take 2 tabs on day 1, followed by 1 tablet daily.  . [DISCONTINUED] hydrocortisone (ANUSOL-HC) 25 MG suppository Place 1 suppository (25 mg total) rectally 2 (two) times daily.  . [DISCONTINUED] predniSONE (DELTASONE) 50 MG tablet Take 1 tablet (50 mg total) by mouth daily.   No facility-administered encounter medications on file as of 04/19/2019.     Allergies as of 04/19/2019  . (No Known Allergies)    Past Medical History:  Diagnosis Date  . No pertinent past medical history     Past Surgical History:  Procedure Laterality Date  . LAPAROSCOPIC ABDOMINAL EXPLORATION  1986  . SINUSOTOMY  08/2018    Family History  Problem Relation Age of Onset  . Stroke Mother 62  . Cancer Mother        unsure what type  . High blood pressure  Mother   . Breast cancer Half-Sister   . Colon cancer Neg Hx   . Esophageal cancer Neg Hx   . Stomach cancer Neg Hx   . Rectal cancer Neg Hx     Social History   Socioeconomic History  . Marital status: Married    Spouse name: Not on file  . Number of children: 2  . Years of education: Not on file  . Highest education level: Not on file  Occupational History  . Occupation: Actuary  Social Needs  . Financial resource strain: Not on file  . Food insecurity    Worry: Not on file    Inability: Not on file  . Transportation needs    Medical: Not on file    Non-medical: Not on file  Tobacco Use  . Smoking status: Current Some Day Smoker    Types: Cigarettes  . Smokeless tobacco: Never Used  . Tobacco comment: 3 cigs per week  Substance and Sexual Activity  . Alcohol use: Yes    Alcohol/week: 2.0 standard drinks    Types: 2 Standard drinks or equivalent per week  . Drug use: No  . Sexual activity: Yes    Partners: Male  Lifestyle  . Physical activity    Days per week: Not on file    Minutes per session: Not on file  . Stress: Not on file  Relationships  . Social connections    Talks on phone: Not on file  Gets together: Not on file    Attends religious service: Not on file    Active member of club or organization: Not on file    Attends meetings of clubs or organizations: Not on file    Relationship status: Not on file  . Intimate partner violence    Fear of current or ex partner: Not on file    Emotionally abused: Not on file    Physically abused: Not on file    Forced sexual activity: Not on file  Other Topics Concern  . Not on file  Social History Narrative  . Not on file      Review of systems: Review of Systems  Constitutional: Negative for fever and chills.  HENT: Negative.   Eyes: Negative for blurred vision.  Respiratory: Negative for cough, shortness of breath and wheezing.   Cardiovascular: Negative for chest pain and palpitations.   Gastrointestinal: as per HPI Genitourinary: Negative for dysuria, urgency, frequency and hematuria.  Musculoskeletal: Negative for myalgias, back pain and joint pain.  Skin: Negative for itching and rash.  Neurological: Negative for dizziness, tremors, focal weakness, seizures and loss of consciousness.  Endo/Heme/Allergies: Negative for seasonal allergies.  Psychiatric/Behavioral: Negative for depression, suicidal ideas and hallucinations.  All other systems reviewed and are negative.   Physical Exam: Vitals:   04/19/19 1405  BP: 130/70  Pulse: (!) 103  Temp: 99 F (37.2 C)   Body mass index is 22.5 kg/m. Gen:      No acute distress HEENT:  EOMI, sclera anicteric Neck:     No masses; no thyromegaly Lungs:    Clear to auscultation bilaterally; normal respiratory effort CV:         Regular rate and rhythm; no murmurs Abd:      + bowel sounds; soft, non-tender; no palpable masses, no distension Ext:    No edema; adequate peripheral perfusion Skin:      Warm and dry; no rash Neuro: alert and oriented x 3 Psych: normal mood and affect Rectal exam: Partial prolapse of rectal mucosa and hemorrhoids, easily able to reduce, decreased anal sphincter tone, no anal fissure or external hemorrhoids   Data Reviewed:  Reviewed labs, radiology imaging, old records and pertinent past GI work up   Assessment and Plan/Recommendations:  58 year old female with grade 3 hemorrhoids and partial prolapse of rectal mucosa with intermittent bright red blood per rectum  Continue MiraLAX 1 capful daily as needed to have soft bowel movement  Benefiber 1 tablespoon 3 times daily with meals  Kegel exercise to improve sphincter tone  Anusol suppository at bedtime as needed  Will refer to Dr. Leighton Ruff at [facility] for management of partial rectal prolapse and symptomatic hemorrhoids  Recall colonoscopy June 2030   Greater than 50% of the time used for counseling as well as treatment plan and  follow-up. She had multiple questions which were answered to her satisfaction  K. Denzil Magnuson , MD    CC: Vonna Drafts, FNP

## 2019-04-20 ENCOUNTER — Telehealth: Payer: Self-pay | Admitting: *Deleted

## 2019-04-20 NOTE — Telephone Encounter (Signed)
Faxed over records and demographics today to [facility] for [facility]

## 2019-04-29 NOTE — Telephone Encounter (Signed)
Left message with [facility] Nira Conn, new pt coordinator) to ask that patient be scheduled for appointment. I was told by Lelon Frohlich, scheduler that patient's information is in their system but no notes have been made indicating that patient has been contacted for appointment.

## 2019-05-02 NOTE — Telephone Encounter (Signed)
Per Nira Conn at [facility], patient has been scheduled with Dr Marcello Moores on 05/10/19 at 10:50 am. They have left a message letting patient know of appointment. I have also left a message for patient to call us back.

## 2019-05-03 NOTE — Telephone Encounter (Signed)
Left message for patient of Date and Time of her [facility] appointment

## 2019-05-10 ENCOUNTER — Ambulatory Visit: Payer: Self-pay | Admitting: General Surgery

## 2019-05-10 NOTE — H&P (Signed)
History of Present Illness Susan Ruff MD; 4/54/0981 11:19 AM) The patient is a 58 year old female who presents with a complaint of Rectal bleeding. 58 year old female who presents to the office with complaints of rectal bleeding. This is been occurring for several years. She saw Dr. Silverio Decamp and a colonoscopy was performed. This showed no malignancy, but she did have internal hemorrhoids with some rectal mucosal prolapse. She is here today for further evaluation.   Past Surgical History [name], LPN; 1/91/4782 95:62 AM) No pertinent past surgical history  Diagnostic Studies History [name], LPN; 10/22/8655 84:69 AM) Colonoscopy within last year Mammogram within last year Pap Smear 1-5 years ago  Allergies [name], LPN; 03/21/5283 13:24 AM) No Known Allergies [05/10/2019]:  Medication History [name], LPN; 12/22/270 53:66 AM) Hydrocortisone Acetate (25MG  Suppository, Rectal) Active. Multivitamin (Oral) Active. Xyzal Allergy 24HR (5MG  Tablet, Oral) Active. Medications Reconciled  Social History [name], LPN; 4/40/3474 25:95 AM) Alcohol use Moderate alcohol use. Caffeine use Tea. No drug use Tobacco use Current some day smoker.  Family History [name], LPN; 6/38/7564 33:29 AM) Breast Cancer Sister. Depression Mother. Hypertension Mother.  Pregnancy / Birth History [name], LPN; 02/07/8415 60:63 AM) Age at menarche 32 years. Age of menopause 98-50 Gravida 2 Irregular periods Maternal age 79-20 Para 2  Other Problems [name], LPN; 0/16/0109 32:35 AM) Back Pain     Review of Systems [name] LPN; 5/73/2202 54:27 AM) General Present- Night Sweats and Weight Gain. Not Present- Appetite Loss, Chills, Fatigue, Fever and Weight Loss. Skin Not Present- Change in Wart/Mole, Dryness, Hives, Jaundice, New Lesions, Non-Healing Wounds, Rash and Ulcer. HEENT Present- Earache, Ringing in the  Ears, Seasonal Allergies and Sinus Pain. Not Present- Hearing Loss, Hoarseness, Nose Bleed, Oral Ulcers, Sore Throat, Visual Disturbances, Wears glasses/contact lenses and Yellow Eyes. Respiratory Present- Wheezing. Not Present- Bloody sputum, Chronic Cough, Difficulty Breathing and Snoring. Breast Not Present- Breast Mass, Breast Pain, Nipple Discharge and Skin Changes. Cardiovascular Not Present- Chest Pain, Difficulty Breathing Lying Down, Leg Cramps, Palpitations, Rapid Heart Rate, Shortness of Breath and Swelling of Extremities. Gastrointestinal Present- Change in Bowel Habits and Rectal Pain. Not Present- Abdominal Pain, Bloating, Bloody Stool, Chronic diarrhea, Constipation, Difficulty Swallowing, Excessive gas, Gets full quickly at meals, Hemorrhoids, Indigestion, Nausea and Vomiting. Female Genitourinary Present- Frequency. Not Present- Nocturia, Painful Urination, Pelvic Pain and Urgency. Musculoskeletal Present- Back Pain. Not Present- Joint Pain, Joint Stiffness, Muscle Pain, Muscle Weakness and Swelling of Extremities. Neurological Present- Numbness. Not Present- Decreased Memory, Fainting, Headaches, Seizures, Tingling, Tremor, Trouble walking and Weakness. Psychiatric Present- Change in Sleep Pattern. Not Present- Anxiety, Bipolar, Depression, Fearful and Frequent crying. Endocrine Present- Hot flashes. Not Present- Cold Intolerance, Excessive Hunger, Hair Changes, Heat Intolerance and New Diabetes. Hematology Not Present- Blood Thinners, Easy Bruising, Excessive bleeding, Gland problems, HIV and Persistent Infections.  Vitals [name] LPN; 0/62/3762 83:15 AM) 05/10/2019 11:15 AM Height: 62in Temp.: 97.41F(Temporal)  Pulse: 92 (Regular)  BP: 122/70 (Sitting, Left Arm, Standard)        Physical Exam Susan Ruff MD; 1/76/1607 11:27 AM)  General Mental Status-Alert. General Appearance-Not in acute distress. Build & Nutrition-Well  nourished. Posture-Normal posture. Gait-Normal.  Head and Neck Head-normocephalic, atraumatic with no lesions or palpable masses. Trachea-midline.  Chest and Lung Exam Chest and lung exam reveals -on auscultation, normal breath sounds, no adventitious sounds and normal vocal resonance.  Cardiovascular Cardiovascular examination reveals -normal heart sounds, regular rate and rhythm with no murmurs and no  digital clubbing, cyanosis, edema, increased warmth or tenderness.  Abdomen Inspection Inspection of the abdomen reveals - No Hernias. Palpation/Percussion Palpation and Percussion of the abdomen reveal - Soft, Non Tender, No Rigidity (guarding), No hepatosplenomegaly and No Palpable abdominal masses.  Rectal Anorectal Exam External - normal external exam. Internal - decreased sphincter tone.  Neurologic Neurologic evaluation reveals -alert and oriented x 3 with no impairment of recent or remote memory, normal attention span and ability to concentrate, normal sensation and normal coordination.  Musculoskeletal Normal Exam - Bilateral-Upper Extremity Strength Normal and Lower Extremity Strength Normal.   Results Susan Ruff MD; 9/77/4142 11:28 AM) Procedures  Name Value Date ANOSCOPY, DIAGNOSTIC (39532) [ Hemorrhoids ] Procedure Other: Procedure: Anoscopy....Marland KitchenMarland KitchenSurgeon: Marcello Moores....Marland KitchenMarland KitchenAfter the risks and benefits were explained, verbal consent was obtained for above procedure. A medical assistant chaperone was present thoroughout the entire procedure. ....Marland KitchenMarland KitchenAnesthesia: none....Marland KitchenMarland KitchenDiagnosis: rectal bleeding....Marland KitchenMarland KitchenFindings: Large grade 2 hemorrhoids in all 3 positions.  Performed: 05/10/2019 11:28 AM    Assessment & Plan Susan Ruff MD; 0/23/3435 11:28 AM)  PROLAPSED INTERNAL HEMORRHOIDS, GRADE 2 (K64.1) Impression: 58 year old female with rectal bleeding with bowel movements and history of constipation. On exam today, she has large grade 2  hemorrhoids with signs of inflammation consistent with her symptoms. We discussed rubber band ligation versus transient hemorrhoidal dearterialization as ways to minimize her rectal bleeding. We also discussed the need to avoid constipation by utilizing a multi-tiered approach with fiber, water, and MiraLAX. We discussed with this regimen, her chances of long-term success and minimization of her rectal bleeding would be good. There is a risk that her hemorrhoids could return, especially if she continues to strain with bowel movements. We discussed that rectal bleeding. Would continue until she healed from surgery. We discussed that the surgery can be quite painful, but this usually resolves in 2-3 weeks.

## 2019-05-10 NOTE — H&P (View-Only) (Signed)
History of Present Illness Leighton Ruff MD; 3/61/4431 11:19 AM) The patient is a 58 year old female who presents with a complaint of Rectal bleeding. 58 year old female who presents to the office with complaints of rectal bleeding. This is been occurring for several years. She saw Dr. Silverio Decamp and a colonoscopy was performed. This showed no malignancy, but she did have internal hemorrhoids with some rectal mucosal prolapse. She is here today for further evaluation.   Past Surgical History [name], LPN; 5/40/0867 61:95 AM) No pertinent past surgical history  Diagnostic Studies History [name], LPN; 0/93/2671 24:58 AM) Colonoscopy within last year Mammogram within last year Pap Smear 1-5 years ago  Allergies [name], LPN; 0/99/8338 25:05 AM) No Known Allergies [05/10/2019]:  Medication History [name], LPN; 3/97/6734 19:37 AM) Hydrocortisone Acetate (25MG  Suppository, Rectal) Active. Multivitamin (Oral) Active. Xyzal Allergy 24HR (5MG  Tablet, Oral) Active. Medications Reconciled  Social History [name], LPN; 05/24/4096 35:32 AM) Alcohol use Moderate alcohol use. Caffeine use Tea. No drug use Tobacco use Current some day smoker.  Family History [name], LPN; 9/92/4268 34:19 AM) Breast Cancer Sister. Depression Mother. Hypertension Mother.  Pregnancy / Birth History [name], LPN; 03/13/2978 89:21 AM) Age at menarche 3 years. Age of menopause 48-50 Gravida 2 Irregular periods Maternal age 72-20 Para 2  Other Problems [name], LPN; 1/94/1740 81:44 AM) Back Pain     Review of Systems [name] LPN; 05/09/5630 49:70 AM) General Present- Night Sweats and Weight Gain. Not Present- Appetite Loss, Chills, Fatigue, Fever and Weight Loss. Skin Not Present- Change in Wart/Mole, Dryness, Hives, Jaundice, New Lesions, Non-Healing Wounds, Rash and Ulcer. HEENT Present- Earache, Ringing in the  Ears, Seasonal Allergies and Sinus Pain. Not Present- Hearing Loss, Hoarseness, Nose Bleed, Oral Ulcers, Sore Throat, Visual Disturbances, Wears glasses/contact lenses and Yellow Eyes. Respiratory Present- Wheezing. Not Present- Bloody sputum, Chronic Cough, Difficulty Breathing and Snoring. Breast Not Present- Breast Mass, Breast Pain, Nipple Discharge and Skin Changes. Cardiovascular Not Present- Chest Pain, Difficulty Breathing Lying Down, Leg Cramps, Palpitations, Rapid Heart Rate, Shortness of Breath and Swelling of Extremities. Gastrointestinal Present- Change in Bowel Habits and Rectal Pain. Not Present- Abdominal Pain, Bloating, Bloody Stool, Chronic diarrhea, Constipation, Difficulty Swallowing, Excessive gas, Gets full quickly at meals, Hemorrhoids, Indigestion, Nausea and Vomiting. Female Genitourinary Present- Frequency. Not Present- Nocturia, Painful Urination, Pelvic Pain and Urgency. Musculoskeletal Present- Back Pain. Not Present- Joint Pain, Joint Stiffness, Muscle Pain, Muscle Weakness and Swelling of Extremities. Neurological Present- Numbness. Not Present- Decreased Memory, Fainting, Headaches, Seizures, Tingling, Tremor, Trouble walking and Weakness. Psychiatric Present- Change in Sleep Pattern. Not Present- Anxiety, Bipolar, Depression, Fearful and Frequent crying. Endocrine Present- Hot flashes. Not Present- Cold Intolerance, Excessive Hunger, Hair Changes, Heat Intolerance and New Diabetes. Hematology Not Present- Blood Thinners, Easy Bruising, Excessive bleeding, Gland problems, HIV and Persistent Infections.  Vitals [name] LPN; 2/63/7858 85:02 AM) 05/10/2019 11:15 AM Height: 62in Temp.: 97.47F(Temporal)  Pulse: 92 (Regular)  BP: 122/70 (Sitting, Left Arm, Standard)        Physical Exam Leighton Ruff MD; 7/74/1287 11:27 AM)  General Mental Status-Alert. General Appearance-Not in acute distress. Build & Nutrition-Well  nourished. Posture-Normal posture. Gait-Normal.  Head and Neck Head-normocephalic, atraumatic with no lesions or palpable masses. Trachea-midline.  Chest and Lung Exam Chest and lung exam reveals -on auscultation, normal breath sounds, no adventitious sounds and normal vocal resonance.  Cardiovascular Cardiovascular examination reveals -normal heart sounds, regular rate and rhythm with no murmurs and no  digital clubbing, cyanosis, edema, increased warmth or tenderness.  Abdomen Inspection Inspection of the abdomen reveals - No Hernias. Palpation/Percussion Palpation and Percussion of the abdomen reveal - Soft, Non Tender, No Rigidity (guarding), No hepatosplenomegaly and No Palpable abdominal masses.  Rectal Anorectal Exam External - normal external exam. Internal - decreased sphincter tone.  Neurologic Neurologic evaluation reveals -alert and oriented x 3 with no impairment of recent or remote memory, normal attention span and ability to concentrate, normal sensation and normal coordination.  Musculoskeletal Normal Exam - Bilateral-Upper Extremity Strength Normal and Lower Extremity Strength Normal.   Results Leighton Ruff MD; 10/14/4495 11:28 AM) Procedures  Name Value Date ANOSCOPY, DIAGNOSTIC (53005) [ Hemorrhoids ] Procedure Other: Procedure: Anoscopy....Marland KitchenMarland KitchenSurgeon: Marcello Moores....Marland KitchenMarland KitchenAfter the risks and benefits were explained, verbal consent was obtained for above procedure. A medical assistant chaperone was present thoroughout the entire procedure. ....Marland KitchenMarland KitchenAnesthesia: none....Marland KitchenMarland KitchenDiagnosis: rectal bleeding....Marland KitchenMarland KitchenFindings: Large grade 2 hemorrhoids in all 3 positions.  Performed: 05/10/2019 11:28 AM    Assessment & Plan Leighton Ruff MD; 10/01/2109 11:28 AM)  PROLAPSED INTERNAL HEMORRHOIDS, GRADE 2 (K64.1) Impression: 58 year old female with rectal bleeding with bowel movements and history of constipation. On exam today, she has large grade 2  hemorrhoids with signs of inflammation consistent with her symptoms. We discussed rubber band ligation versus transient hemorrhoidal dearterialization as ways to minimize her rectal bleeding. We also discussed the need to avoid constipation by utilizing a multi-tiered approach with fiber, water, and MiraLAX. We discussed with this regimen, her chances of long-term success and minimization of her rectal bleeding would be good. There is a risk that her hemorrhoids could return, especially if she continues to strain with bowel movements. We discussed that rectal bleeding. Would continue until she healed from surgery. We discussed that the surgery can be quite painful, but this usually resolves in 2-3 weeks.

## 2019-05-31 ENCOUNTER — Other Ambulatory Visit (HOSPITAL_COMMUNITY)
Admission: RE | Admit: 2019-05-31 | Discharge: 2019-05-31 | Disposition: A | Discharge status: Home / Self Care | Payer: 59 | Source: Ambulatory Visit | Attending: General Surgery | Admitting: General Surgery

## 2019-05-31 DIAGNOSIS — Z01812 Encounter for preprocedural laboratory examination: Secondary | ICD-10-CM | POA: Insufficient documentation

## 2019-05-31 DIAGNOSIS — Z20828 Contact with and (suspected) exposure to other viral communicable diseases: Secondary | ICD-10-CM | POA: Diagnosis present

## 2019-06-01 ENCOUNTER — Other Ambulatory Visit: Payer: Self-pay

## 2019-06-01 ENCOUNTER — Encounter (HOSPITAL_BASED_OUTPATIENT_CLINIC_OR_DEPARTMENT_OTHER): Payer: Self-pay | Admitting: *Deleted

## 2019-06-01 LAB — SARS CORONAVIRUS 2 (TAT 6-24 HRS): SARS Coronavirus 2: NEGATIVE

## 2019-06-01 MED ORDER — BUPIVACAINE LIPOSOME 1.3 % IJ SUSP
20.0000 mL | Freq: Once | INTRAMUSCULAR | Status: DC
  Filled 2019-06-01: qty 20

## 2019-06-01 NOTE — Progress Notes (Signed)
Spoke w/ pt via phone for pre-op interview.  Npo after mn w/ exception clear liquids until 0800 then nothing by mouth, pt verbalized understanding.  Arrive at 1145.  Pt had covid test done 05-31-2019.  Will do nasocort spray am dos w/ sips of water.

## 2019-06-02 ENCOUNTER — Encounter (HOSPITAL_BASED_OUTPATIENT_CLINIC_OR_DEPARTMENT_OTHER): Payer: Self-pay

## 2019-06-02 ENCOUNTER — Encounter (HOSPITAL_BASED_OUTPATIENT_CLINIC_OR_DEPARTMENT_OTHER)
Admission: RE | Disposition: A | Discharge status: Home / Self Care | Payer: Self-pay | Source: Home / Self Care | Attending: General Surgery

## 2019-06-02 ENCOUNTER — Ambulatory Visit (HOSPITAL_BASED_OUTPATIENT_CLINIC_OR_DEPARTMENT_OTHER): Payer: 59 | Admitting: Certified Registered"

## 2019-06-02 ENCOUNTER — Ambulatory Visit (HOSPITAL_BASED_OUTPATIENT_CLINIC_OR_DEPARTMENT_OTHER)
Admission: RE | Admit: 2019-06-02 | Discharge: 2019-06-02 | Disposition: A | Discharge status: Home / Self Care | Payer: 59 | Attending: General Surgery | Admitting: General Surgery

## 2019-06-02 DIAGNOSIS — F17200 Nicotine dependence, unspecified, uncomplicated: Secondary | ICD-10-CM | POA: Diagnosis not present

## 2019-06-02 DIAGNOSIS — K642 Third degree hemorrhoids: Secondary | ICD-10-CM | POA: Insufficient documentation

## 2019-06-02 DIAGNOSIS — K641 Second degree hemorrhoids: Secondary | ICD-10-CM | POA: Insufficient documentation

## 2019-06-02 HISTORY — DX: Other seasonal allergic rhinitis: J30.2

## 2019-06-02 HISTORY — DX: Presence of dental prosthetic device (complete) (partial): Z97.2

## 2019-06-02 HISTORY — DX: Other hemorrhoids: K64.8

## 2019-06-02 HISTORY — DX: Diverticulosis of large intestine without perforation or abscess without bleeding: K57.30

## 2019-06-02 HISTORY — PX: TRANSANAL HEMORRHOIDAL DEARTERIALIZATION: SHX6136

## 2019-06-02 SURGERY — TRANSANAL HEMORRHOIDAL DEARTERIALIZATION
Anesthesia: General

## 2019-06-02 MED ORDER — METAMUCIL SMOOTH TEXTURE 58.6 % PO POWD: 1.0000 | Freq: Three times a day (TID) | ORAL | 12 refills | Status: AC

## 2019-06-02 MED ORDER — PROPOFOL 10 MG/ML IV BOLUS
INTRAVENOUS | Status: AC
  Filled 2019-06-02: qty 20

## 2019-06-02 MED ORDER — DEXAMETHASONE SODIUM PHOSPHATE 10 MG/ML IJ SOLN
INTRAMUSCULAR | Status: AC
  Filled 2019-06-02: qty 1

## 2019-06-02 MED ORDER — CELECOXIB 200 MG PO CAPS
200.0000 mg | ORAL_CAPSULE | ORAL | Status: AC
  Administered 2019-06-02: 200 mg via ORAL
  Filled 2019-06-02: qty 1

## 2019-06-02 MED ORDER — LIDOCAINE 2% (20 MG/ML) 5 ML SYRINGE
INTRAMUSCULAR | Status: DC | PRN
  Administered 2019-06-02: 40 mg via INTRAVENOUS

## 2019-06-02 MED ORDER — PROPOFOL 500 MG/50ML IV EMUL
INTRAVENOUS | Status: AC
  Filled 2019-06-02: qty 50

## 2019-06-02 MED ORDER — FENTANYL CITRATE (PF) 100 MCG/2ML IJ SOLN
INTRAMUSCULAR | Status: AC
  Filled 2019-06-02: qty 2

## 2019-06-02 MED ORDER — GABAPENTIN 300 MG PO CAPS
300.0000 mg | ORAL_CAPSULE | ORAL | Status: DC
  Filled 2019-06-02: qty 1

## 2019-06-02 MED ORDER — OXYCODONE HCL 5 MG PO TABS: 5.0000 mg | ORAL_TABLET | Freq: Four times a day (QID) | ORAL | 0 refills | Status: DC | PRN

## 2019-06-02 MED ORDER — FENTANYL CITRATE (PF) 100 MCG/2ML IJ SOLN
INTRAMUSCULAR | Status: DC | PRN
  Administered 2019-06-02: 50 ug via INTRAVENOUS
  Administered 2019-06-02 (×2): 25 ug via INTRAVENOUS

## 2019-06-02 MED ORDER — PROPOFOL 500 MG/50ML IV EMUL
INTRAVENOUS | Status: DC | PRN
  Administered 2019-06-02: 100 ug/kg/min via INTRAVENOUS

## 2019-06-02 MED ORDER — PROPOFOL 10 MG/ML IV BOLUS
INTRAVENOUS | Status: DC | PRN
  Administered 2019-06-02 (×7): 20 mg via INTRAVENOUS

## 2019-06-02 MED ORDER — ACETAMINOPHEN 500 MG PO TABS
ORAL_TABLET | ORAL | Status: AC
  Filled 2019-06-02: qty 2

## 2019-06-02 MED ORDER — MIDAZOLAM HCL 2 MG/2ML IJ SOLN
INTRAMUSCULAR | Status: DC | PRN
  Administered 2019-06-02: 0.5 mg via INTRAVENOUS
  Administered 2019-06-02: 1 mg via INTRAVENOUS
  Administered 2019-06-02: 0.5 mg via INTRAVENOUS

## 2019-06-02 MED ORDER — BUPIVACAINE-EPINEPHRINE 0.5% -1:200000 IJ SOLN
INTRAMUSCULAR | Status: DC | PRN
  Administered 2019-06-02 (×2): 15 mL

## 2019-06-02 MED ORDER — LACTATED RINGERS IV SOLN
INTRAVENOUS | Status: DC
  Administered 2019-06-02 (×2): via INTRAVENOUS
  Filled 2019-06-02: qty 1000

## 2019-06-02 MED ORDER — POLYETHYLENE GLYCOL 3350 17 GM/SCOOP PO POWD: 8.5000 g | Freq: Two times a day (BID) | ORAL | Status: DC | PRN

## 2019-06-02 MED ORDER — DOCUSATE SODIUM 100 MG PO CAPS: 100.0000 mg | ORAL_CAPSULE | Freq: Three times a day (TID) | ORAL | 0 refills | Status: DC

## 2019-06-02 MED ORDER — CELECOXIB 200 MG PO CAPS
ORAL_CAPSULE | ORAL | Status: AC
  Filled 2019-06-02: qty 1

## 2019-06-02 MED ORDER — ONDANSETRON HCL 4 MG/2ML IJ SOLN
INTRAMUSCULAR | Status: DC | PRN
  Administered 2019-06-02: 4 mg via INTRAVENOUS

## 2019-06-02 MED ORDER — LIDOCAINE 2% (20 MG/ML) 5 ML SYRINGE
INTRAMUSCULAR | Status: AC
  Filled 2019-06-02: qty 5

## 2019-06-02 MED ORDER — GABAPENTIN 300 MG PO CAPS
ORAL_CAPSULE | ORAL | Status: AC
  Filled 2019-06-02: qty 1

## 2019-06-02 MED ORDER — LIDOCAINE 5 % EX OINT
TOPICAL_OINTMENT | CUTANEOUS | Status: DC | PRN
  Administered 2019-06-02: 1

## 2019-06-02 MED ORDER — BUPIVACAINE LIPOSOME 1.3 % IJ SUSP
INTRAMUSCULAR | Status: DC | PRN
  Administered 2019-06-02: 5 mL
  Administered 2019-06-02: 15 mL

## 2019-06-02 MED ORDER — MIDAZOLAM HCL 2 MG/2ML IJ SOLN
INTRAMUSCULAR | Status: AC
  Filled 2019-06-02: qty 2

## 2019-06-02 MED ORDER — ACETAMINOPHEN 500 MG PO TABS
1000.0000 mg | ORAL_TABLET | ORAL | Status: DC
  Filled 2019-06-02: qty 2

## 2019-06-02 MED ORDER — ONDANSETRON HCL 4 MG/2ML IJ SOLN
INTRAMUSCULAR | Status: AC
  Filled 2019-06-02: qty 2

## 2019-06-02 MED ORDER — DEXAMETHASONE SODIUM PHOSPHATE 10 MG/ML IJ SOLN
INTRAMUSCULAR | Status: DC | PRN
  Administered 2019-06-02: 4 mg via INTRAVENOUS

## 2019-06-02 SURGICAL SUPPLY — 32 items
BLADE HEX COATED 2.75 (ELECTRODE) ×1 IMPLANT
BRIEF STRETCH FOR OB PAD LRG (UNDERPADS AND DIAPERS) ×1 IMPLANT
COVER BACK TABLE 60X90IN (DRAPES) ×2 IMPLANT
COVER WAND RF STERILE (DRAPES) ×4 IMPLANT
DECANTER SPIKE VIAL GLASS SM (MISCELLANEOUS) IMPLANT
DRAPE HYSTEROSCOPY (DRAPE) ×2 IMPLANT
DRAPE SHEET LG 3/4 BI-LAMINATE (DRAPES) ×2 IMPLANT
DRSG PAD ABDOMINAL 8X10 ST (GAUZE/BANDAGES/DRESSINGS) ×2 IMPLANT
ELECT REM PT RETURN 9FT ADLT (ELECTROSURGICAL) ×2
ELECTRODE REM PT RTRN 9FT ADLT (ELECTROSURGICAL) ×1 IMPLANT
GAUZE SPONGE 4X4 12PLY STRL (GAUZE/BANDAGES/DRESSINGS) ×1 IMPLANT
GAUZE SPONGE 4X4 3PLY NS LF (GAUZE/BANDAGES/DRESSINGS) ×1 IMPLANT
GLOVE BIO SURGEON STRL SZ 6.5 (GLOVE) ×2 IMPLANT
GLOVE BIOGEL PI IND STRL 7.0 (GLOVE) ×1 IMPLANT
GLOVE BIOGEL PI INDICATOR 7.0 (GLOVE) ×1
GOWN STRL REUS W/TWL 2XL LVL3 (GOWN DISPOSABLE) ×2 IMPLANT
GOWN STRL REUS W/TWL XL LVL3 (GOWN DISPOSABLE) ×2 IMPLANT
KIT SIGMOIDOSCOPE (SET/KITS/TRAYS/PACK) IMPLANT
KIT SLIDE ONE PROLAPS HEMORR (KITS) ×1 IMPLANT
KIT TURNOVER CYSTO (KITS) ×2 IMPLANT
LEGGING LITHOTOMY PAIR STRL (DRAPES) ×2 IMPLANT
LUBRICANT JELLY K Y 4OZ (MISCELLANEOUS) ×2 IMPLANT
NEEDLE HYPO 22GX1.5 SAFETY (NEEDLE) ×2 IMPLANT
PACK BASIN DAY SURGERY FS (CUSTOM PROCEDURE TRAY) ×2 IMPLANT
SUT CHROMIC 2 0 SH (SUTURE) ×2 IMPLANT
SUT CHROMIC 3 0 SH 27 (SUTURE) ×4 IMPLANT
SUT VIC AB 2-0 UR6 27 (SUTURE) IMPLANT
SYR CONTROL 10ML LL (SYRINGE) ×2 IMPLANT
TOWEL OR 17X26 10 PK STRL BLUE (TOWEL DISPOSABLE) ×2 IMPLANT
TRAY DSU PREP LF (CUSTOM PROCEDURE TRAY) ×2 IMPLANT
TUBE CONNECTING 12X1/4 (SUCTIONS) ×2 IMPLANT
YANKAUER SUCT BULB TIP NO VENT (SUCTIONS) ×2 IMPLANT

## 2019-06-02 NOTE — Transfer of Care (Signed)
Immediate Anesthesia Transfer of Care Note  Patient: Susan Coleman  Procedure(s) Performed: TRANSANAL HEMORRHOIDAL DEARTERIALIZATION (N/A )  Patient Location: PACU  Anesthesia Type:MAC  Level of Consciousness: awake, alert , oriented, drowsy and patient cooperative  Airway & Oxygen Therapy: Patient Spontanous Breathing and Patient connected to face mask oxygen  Post-op Assessment: Report given to RN and Post -op Vital signs reviewed and stable  Post vital signs: Reviewed and stable  Last Vitals:  Vitals Value Taken Time  BP    Temp    Pulse    Resp    SpO2      Last Pain:  Vitals:   06/02/19 1237  TempSrc: Oral  PainSc: 0-No pain      Patients Stated Pain Goal: 5 (88/91/69 4503)  Complications: No apparent anesthesia complications

## 2019-06-02 NOTE — Op Note (Signed)
06/02/2019  1:47 PM  PATIENT:  Susan Coleman  58 y.o. female  Patient Care Team: Vonna Drafts, FNP as PCP - General (Nurse Practitioner)  PRE-OPERATIVE DIAGNOSIS:  RECTAL BLEEDING, GRADE 2 HEMORRHOIDS  POST-OPERATIVE DIAGNOSIS:  RECTAL BLEEDING, GRADE 2 HEMORRHOIDS, MUCOSAL PROLAPSE   PROCEDURE:  TRANSANAL HEMORRHOIDAL DEARTERIALIZATION   Surgeon(s): Leighton Ruff, MD  ASSISTANT: none   ANESTHESIA:   local and MAC  EBL: 20 ml Total I/O In: 1000 [I.V.:1000] Out: -   DRAINS: none   SPECIMEN:  No Specimen  DISPOSITION OF SPECIMEN:  N/A  COUNTS:  YES  PLAN OF CARE: Discharge to home after PACU  PATIENT DISPOSITION:  PACU - hemodynamically stable.  INDICATION: 58 y.o. F with grade 2 internal hemorrhoids   OR FINDINGS: Grade 3 L lateral hemorrhoid with significant mucosal prolapse, Grade 2 R anterior and posterior hemorrhoids  Description: Informed consent was confirmed. Patient underwent general anesthesia without difficulty. Patient was placed into lithotomy positioning.  The perianal region was prepped and draped in sterile fashion. Surgical time out confirmed or plan.  I placed a rectal block using 0.5% Marcaine with Epi mixed with Experel.    I did digital rectal examination and then transitioned over to anoscopy to get a sense of the anatomy.  I switched over to the [facility] fiberoptically lit Doppler anocope.   Using the Doppler on the tip of the [place] anoscope, I identified the arterial hemorrhoidal vessels coming in in the classic hexagonal anatomical pattern  (right posterior/lateral/anterior, left posterior /lateral/anterior).    I proceeded to ligate the hemorrhoidal arteries. I used a 2-0 Vicryl suture on a UR-6 needle in a figure-of-eight fashion over the signal around 6 cm proximal to the anal verge. I then ran that stitch longitudinally more distally to the dentate line. I then tied that stitch down to cause a hemorrhoidopexy. I did that for all 6 locations.     I redid Doppler anoscopy. I Identified a signal at the L anterior and posterior locations.  I isolated and ligated both of these with a figure-of-eight stitch. Signals went away.  At completion of this, all hemorrhoids were reduced into the rectum.  There is no more prolapse. External anatomy looked normal.  I repeated anoscopy and examination.   Hemostasis was good.  Patient is being extubated go to recovery room.  I am about to discuss the patient's status to the family.

## 2019-06-02 NOTE — Interval H&P Note (Signed)
History and Physical Interval Note:  06/02/2019 12:39 PM  Susan Coleman  has presented today for surgery, with the diagnosis of RECTAL BLEEDING, GRADE 2 HEMORRHOIDS.  The various methods of treatment have been discussed with the patient and family. After consideration of risks, benefits and other options for treatment, the patient has consented to  Procedure(s): TRANSANAL HEMORRHOIDAL DEARTERIALIZATION (N/A) as a surgical intervention.  The patient's history has been reviewed, patient examined, no change in status, stable for surgery.  I have reviewed the patient's chart and labs.  Questions were answered to the patient's satisfaction.    Rosario Adie, MD  [facility]

## 2019-06-02 NOTE — Discharge Instructions (Addendum)
ANORECTAL SURGERY: POST OP INSTRUCTIONS °1. Take your usually prescribed home medications unless otherwise directed. °2. DIET: During the first few hours after surgery sip on some liquids until you are able to urinate.  It is normal to not urinate for several hours after this surgery.  If you feel uncomfortable, please contact the office for instructions.  After you are able to urinate,you may eat, if you feel like it.  Follow a light bland diet the first 24 hours after arrival home, such as soup, liquids, crackers, etc.  Be sure to include lots of fluids daily (6-8 glasses).  Avoid fast food or heavy meals, as your are more likely to get nauseated.  Eat a low fat diet the next few days after surgery.  Limit caffeine intake to 1-2 servings a day. °3. PAIN CONTROL: °a. Pain is best controlled by a usual combination of several different methods TOGETHER: °i. Muscle relaxation: Soak in a warm bath (or Sitz bath) three times a day and after bowel movements.  Continue to do this until all pain is resolved.  °ii. Over the counter pain medication °iii. Prescription pain medication °b. Most patients will experience some swelling and discomfort in the anus/rectal area and incisions.  Heat such as warm towels, sitz baths, warm baths, etc to help relax tight/sore spots and speed recovery.  Some people prefer to use ice, especially in the first couple days after surgery, as it may decrease the pain and swelling, or alternate between ice & heat.  Experiment to what works for you.  Swelling and bruising can take several weeks to resolve.  Pain can take even longer to completely resolve. °c. It is helpful to take an over-the-counter pain medication regularly for the first few weeks.  Choose one of the following that works best for you: °i. Naproxen (Aleve, etc)  Two 220mg tabs twice a day °ii. Ibuprofen (Advil, etc) Three 200mg tabs four times a day (every meal & bedtime) °d. A  prescription for pain medication (such as percocet,  oxycodone, hydrocodone, etc) should be given to you upon discharge.  Take your pain medication as prescribed.  °i. If you are having problems/concerns with the prescription medicine (does not control pain, nausea, vomiting, rash, itching, etc), please call us [phone] to see if we need to switch you to a different pain medicine that will work better for you and/or control your side effect better. °ii. If you need a refill on your pain medication, please contact your pharmacy.  They will contact our office to request authorization. Prescriptions will not be filled after 5 pm or on week-ends. °4. KEEP YOUR BOWELS REGULAR and AVOID CONSTIPATION °a. The goal is one to two soft bowel movements a day.  You should at least have a bowel movement every other day. °b. Avoid getting constipated.  Between the surgery and the pain medications, it is common to experience some constipation. This can be very painful after rectal surgery.  Increasing fluid intake and taking a fiber supplement (such as Metamucil, Citrucel, FiberCon, etc) 1-2 times a day regularly will usually help prevent this problem from occurring.  A stool softener like colace is also recommended.  This can be purchased over the counter at your pharmacy.  You can take it up to 3 times a day.  If you do not have a bowel movement after 24 hrs since your surgery, take one does of milk of magnesia.  If you still haven't had a bowel movement 8-12 hours   after that dose, take another dose.  If you don't have a bowel movement 48 hrs after surgery, purchase a Fleets enema from the drug store and administer gently per package instructions.  If you still are having trouble with your bowel movements after that, please call the office for further instructions. °c. If you develop diarrhea or have many loose bowel movements, simplify your diet to bland foods & liquids for a few days.  Stop any stool softeners and decrease your fiber supplement.  Switching to mild  anti-diarrheal medications (Kayopectate, Pepto Bismol) can help.  If this worsens or does not improve, please call us. ° °5. Wound Care °a. Remove your bandages before your first bowel movement or 8 hours after surgery.     °b. Remove any wound packing material at this tim,e as well.  You do not need to repack the wound unless instructed otherwise.  Wear an absorbent pad or soft cotton gauze in your underwear to catch any drainage and help keep the area clean. You should change this every 2-3 hours while awake. °c. Keep the area clean and dry.  Bathe / shower every day, especially after bowel movements.  Keep the area clean by showering / bathing over the incision / wound.   It is okay to soak an open wound to help wash it.  Wet wipes or showers / gentle washing after bowel movements is often less traumatic than regular toilet paper. °d. You may have some styrofoam-like soft packing in the rectum which will come out with the first bowel movement.  °e. You will often notice bleeding with bowel movements.  This should slow down by the end of the first week of surgery °f. Expect some drainage.  This should slow down, too, by the end of the first week of surgery.  Wear an absorbent pad or soft cotton gauze in your underwear until the drainage stops. °g. Do Not sit on a rubber or pillow ring.  This can make you symptoms worse.  You may sit on a soft pillow if needed.  °6. ACTIVITIES as tolerated:   °a. You may resume regular (light) daily activities beginning the next day--such as daily self-care, walking, climbing stairs--gradually increasing activities as tolerated.  If you can walk 30 minutes without difficulty, it is safe to try more intense activity such as jogging, treadmill, bicycling, low-impact aerobics, swimming, etc. °b. Save the most intensive and strenuous activity for last such as sit-ups, heavy lifting, contact sports, etc  Refrain from any heavy lifting or straining until you are off narcotics for pain  control.   °c. You may drive when you are no longer taking prescription pain medication, you can comfortably sit for long periods of time, and you can safely maneuver your car and apply brakes. °d. You may have sexual intercourse when it is comfortable.  °7. FOLLOW UP in our office °a. Please call [facility] at [phone] to set up an appointment to see your surgeon in the office for a follow-up appointment approximately 3-4 weeks after your surgery. °b. Make sure that you call for this appointment the day you arrive home to insure a convenient appointment time. °10. IF YOU HAVE DISABILITY OR FAMILY LEAVE FORMS, BRING THEM TO THE OFFICE FOR PROCESSING.  DO NOT GIVE THEM TO YOUR DOCTOR. ° ° ° ° °WHEN TO CALL US [phone]: °1. Poor pain control °2. Reactions / problems with new medications (rash/itching, nausea, etc)  °3. Fever over 101.5 F (38.5 C) °  Inability to urinate °5. Nausea and/or vomiting °6. Worsening swelling or bruising °7. Continued bleeding from incision. °8. Increased pain, redness, or drainage from the incision ° °The clinic staff is available to answer your questions during regular business hours (8:30am-5pm).  Please don’t hesitate to call and ask to speak to one of our nurses for clinical concerns.   A surgeon from [facility] is always on call at the hospitals °  °If you have a medical emergency, go to the nearest emergency room or call 911. °  ° °[facility] °[address], [address], , [place]  [address] ? °MAIN: [phone] ? TOLL FREE: [phone] ? °FAX [phone] °[email or web address] ° ° °Post Anesthesia Home Care Instructions ° °Activity: °Get plenty of rest for the remainder of the day. A responsible individual must stay with you for 24 hours following the procedure.  °For the next 24 hours, DO NOT: °-Drive a car °-Operate machinery °-Drink alcoholic beverages °-Take any medication unless instructed by your physician °-Make  any legal decisions or sign important papers. ° °Meals: °Start with liquid foods such as gelatin or soup. Progress to regular foods as tolerated. Avoid greasy, spicy, heavy foods. If nausea and/or vomiting occur, drink only clear liquids until the nausea and/or vomiting subsides. Call your physician if vomiting continues. ° °Special Instructions/Symptoms: °Your throat may feel dry or sore from the anesthesia or the breathing tube placed in your throat during surgery. If this causes discomfort, gargle with warm salt water. The discomfort should disappear within 24 hours. ° °If you had a scopolamine patch placed behind your ear for the management of post- operative nausea and/or vomiting: ° °1. The medication in the patch is effective for 72 hours, after which it should be removed.  Wrap patch in a tissue and discard in the trash. Wash hands thoroughly with soap and water. °2. You may remove the patch earlier than 72 hours if you experience unpleasant side effects which may include dry mouth, dizziness or visual disturbances. °3. Avoid touching the patch. Wash your hands with soap and water after contact with the patch. °   °Information for Discharge Teaching: °EXPAREL (bupivacaine liposome injectable suspension)  ° °Your surgeon or anesthesiologist gave you EXPAREL(bupivacaine) to help control your pain after surgery.  °· EXPAREL is a local anesthetic that provides pain relief by numbing the tissue around the surgical site. °· EXPAREL is designed to release pain medication over time and can control pain for up to 72 hours. °· Depending on how you respond to EXPAREL, you may require less pain medication during your recovery. ° °Possible side effects: °· Temporary loss of sensation or ability to move in the area where bupivacaine was injected. °· Nausea, vomiting, constipation °· Rarely, numbness and tingling in your mouth or lips, lightheadedness, or anxiety may occur. °· Call your doctor right away if you think you  may be experiencing any of these sensations, or if you have other questions regarding possible side effects. ° °Follow all other discharge instructions given to you by your surgeon or nurse. Eat a healthy diet and drink plenty of water or other fluids. ° °If you return to the hospital for any reason within 96 hours following the administration of EXPAREL, it is important for health care providers to know that you have received this anesthetic. A teal colored band has been placed on your arm with the date, time and amount of EXPAREL you have received in order to alert   and inform your health care providers. Please leave this armband in place for the full 96 hours following administration, and then you may remove the band. ° °

## 2019-06-02 NOTE — Anesthesia Preprocedure Evaluation (Addendum)
Anesthesia Evaluation  Patient identified by MRN, date of birth, ID band Patient awake    Reviewed: Allergy & Precautions, NPO status , Patient's Chart, lab work & pertinent test results  Airway Mallampati: II  TM Distance: >3 FB     Dental   Pulmonary Current Smoker and Patient abstained from smoking.,    breath sounds clear to auscultation       Cardiovascular negative cardio ROS   Rhythm:Regular Rate:Normal     Neuro/Psych    GI/Hepatic negative GI ROS, Neg liver ROS,   Endo/Other  negative endocrine ROS  Renal/GU negative Renal ROS     Musculoskeletal   Abdominal   Peds  Hematology   Anesthesia Other Findings   Reproductive/Obstetrics                             Anesthesia Physical Anesthesia Plan  ASA: III  Anesthesia Plan: General   Post-op Pain Management:    Induction: Intravenous  PONV Risk Score and Plan: 2 and Ondansetron, Dexamethasone, Midazolam and Treatment may vary due to age or medical condition  Airway Management Planned: Simple Face Mask and Nasal Cannula  Additional Equipment:   Intra-op Plan:   Post-operative Plan:   Informed Consent: I have reviewed the patients History and Physical, chart, labs and discussed the procedure including the risks, benefits and alternatives for the proposed anesthesia with the patient or authorized representative who has indicated his/her understanding and acceptance.     Dental advisory given  Plan Discussed with: CRNA and Anesthesiologist  Anesthesia Plan Comments:         Anesthesia Quick Evaluation

## 2019-06-02 NOTE — Anesthesia Postprocedure Evaluation (Signed)
Anesthesia Post Note  Patient: Susan Coleman  Procedure(s) Performed: TRANSANAL HEMORRHOIDAL DEARTERIALIZATION (N/A )     Patient location during evaluation: PACU Anesthesia Type: General Level of consciousness: awake Pain management: pain level controlled Vital Signs Assessment: post-procedure vital signs reviewed and stable Respiratory status: spontaneous breathing Postop Assessment: no apparent nausea or vomiting Anesthetic complications: no    Last Vitals:  Vitals:   06/02/19 1415 06/02/19 1430  BP: 120/81 118/84  Pulse: 85 78  Resp: 14 15  Temp:    SpO2: 94% 92%    Last Pain:  Vitals:   06/02/19 1430  TempSrc:   PainSc: 0-No pain                 Lakesa Coste

## 2019-06-03 ENCOUNTER — Encounter (HOSPITAL_BASED_OUTPATIENT_CLINIC_OR_DEPARTMENT_OTHER): Payer: Self-pay | Admitting: General Surgery

## 2020-04-19 ENCOUNTER — Encounter (HOSPITAL_COMMUNITY): Payer: Self-pay

## 2020-04-19 ENCOUNTER — Ambulatory Visit (HOSPITAL_COMMUNITY)
Admission: EM | Admit: 2020-04-19 | Discharge: 2020-04-19 | Disposition: A | Discharge status: Home / Self Care | Payer: 59 | Attending: Emergency Medicine | Admitting: Emergency Medicine

## 2020-04-19 ENCOUNTER — Other Ambulatory Visit: Payer: Self-pay

## 2020-04-19 DIAGNOSIS — Z20822 Contact with and (suspected) exposure to covid-19: Secondary | ICD-10-CM | POA: Diagnosis not present

## 2020-04-19 DIAGNOSIS — R197 Diarrhea, unspecified: Secondary | ICD-10-CM | POA: Diagnosis not present

## 2020-04-19 DIAGNOSIS — R5383 Other fatigue: Secondary | ICD-10-CM | POA: Insufficient documentation

## 2020-04-19 NOTE — Discharge Instructions (Signed)
Covid test pending, monitor my chart for results Rest and fluids Follow-up if any symptoms not improving or worsening

## 2020-04-19 NOTE — ED Triage Notes (Signed)
Pt presents with fatigue and diarrhea x 3 days. States husband tested positive for COVID yesterday.

## 2020-04-19 NOTE — ED Provider Notes (Signed)
[facility]    CSN: [id number] Arrival date & time: 04/19/20  1625      History   Chief Complaint Chief Complaint  Patient presents with  . Fatigue  . Covid Exposure  . Diarrhea    HPI Susan Coleman is a 59 y.o. female presenting today for COVID testing after exposure. She reports feeling fatigued over past few days, diarrhea began today. Reports mild URI symptoms.  Denies chest pain or shortness of breath.  Reports that her husband recently tested positive for Covid.  HPI  Past Medical History:  Diagnosis Date  . Diverticulosis of colon   . Internal hemorrhoid, bleeding   . Seasonal allergic rhinitis   . Wears partial dentures    upper    Patient Active Problem List   Diagnosis Date Noted  . Nasal congestion 01/21/2016  . Bacterial vaginitis 01/21/2016  . Allergic rhinitis 12/20/2015  . Healthcare maintenance 12/20/2015  . Acute sinusitis 12/20/2015    Past Surgical History:  Procedure Laterality Date  . COLONOSCOPY  last one 03-21-2019  dr Silverio Decamp  . LAPAROSCOPIC ABDOMINAL EXPLORATION  1986  . NASAL SINUS SURGERY  2019  . TRANSANAL HEMORRHOIDAL DEARTERIALIZATION N/A 06/02/2019   Procedure: TRANSANAL HEMORRHOIDAL DEARTERIALIZATION;  Surgeon: Leighton Ruff, MD;  Location: [facility];  Service: General;  Laterality: N/A;    OB History    Gravida  2   Para  2   Term  2   Preterm      AB      Living  2     SAB      TAB      Ectopic      Multiple      Live Births               Home Medications    Prior to Admission medications   Medication Sig Start Date End Date Taking? Authorizing Provider  Cyanocobalamin (VITAMIN B 12 PO) Take 1 tablet by mouth daily.    [provider]  docusate sodium (COLACE) 100 MG capsule Take 1 capsule (100 mg total) by mouth 3 (three) times daily. 0/17/51   Leighton Ruff, MD  levocetirizine (XYZAL) 5 MG tablet Take 5 mg by mouth every evening.    [provider]    Multiple Vitamins-Minerals (ALIVE WOMENS 50+) CHEW Chew by mouth daily. 2 gummies daily     [provider]  oxyCODONE (OXY IR/ROXICODONE) 5 MG immediate release tablet Take 1-2 tablets (5-10 mg total) by mouth every 6 (six) hours as needed. 0/25/85   Leighton Ruff, MD  polyethylene glycol powder [facility]) 17 GM/SCOOP powder Take 8.5-34 g by mouth 2 (two) times daily as needed. To correct constipation.  Adjust dose over 1-2 months.  Goal = ~1 bowel movement / day 2/77/82   Leighton Ruff, MD  psyllium (METAMUCIL SMOOTH TEXTURE) 58.6 % powder Take 1 packet by mouth 3 (three) times daily. 01/13/52   Leighton Ruff, MD  triamcinolone (NASACORT AQ) 55 MCG/ACT AERO nasal inhaler Place 2 sprays into the nose daily. Patient taking differently: Place 2 sprays into the nose daily.  11/02/16   Wynona Luna, MD    Family History Family History  Problem Relation Age of Onset  . Stroke Mother 22  . Cancer Mother        unsure what type  . High blood pressure Mother   . Breast cancer Half-Sister   . Colon cancer Neg Hx   . Esophageal  cancer Neg Hx   . Stomach cancer Neg Hx   . Rectal cancer Neg Hx     Social History Social History   Tobacco Use  . Smoking status: Current Some Day Smoker    Years: 10.00    Types: Cigarettes  . Smokeless tobacco: Never Used  . Tobacco comment: 3 cigs per week  Vaping Use  . Vaping Use: Never used  Substance Use Topics  . Alcohol use: Yes    Comment: occasional  . Drug use: No     Allergies   Patient has no known allergies.   Review of Systems Review of Systems  Constitutional: Positive for fatigue. Negative for activity change, appetite change, chills and fever.  HENT: Negative for congestion, ear pain, rhinorrhea, sinus pressure, sore throat and trouble swallowing.   Eyes: Negative for discharge and redness.  Respiratory: Negative for cough, chest tightness and shortness of breath.   Cardiovascular: Negative for chest pain.   Gastrointestinal: Positive for diarrhea. Negative for abdominal pain, nausea and vomiting.  Musculoskeletal: Negative for myalgias.  Skin: Negative for rash.  Neurological: Negative for dizziness, light-headedness and headaches.     Physical Exam Triage Vital Signs ED Triage Vitals  Enc Vitals Group     BP      Pulse      Resp      Temp      Temp src      SpO2      Weight      Height      Head Circumference      Peak Flow      Pain Score      Pain Loc      Pain Edu?      Excl. in [place]?    No data found.  Updated Vital Signs BP (!) 137/88 (BP Location: Left Arm)   Pulse 89   Temp 98.1 F (36.7 C) (Oral)   Resp 18   LMP 09/29/2011   SpO2 100%   Visual Acuity Right Eye Distance:   Left Eye Distance:   Bilateral Distance:    Right Eye Near:   Left Eye Near:    Bilateral Near:     Physical Exam Vitals and nursing note reviewed.  Constitutional:      Appearance: She is well-developed.     Comments: No acute distress  HENT:     Head: Normocephalic and atraumatic.     Nose: Nose normal.  Eyes:     Conjunctiva/sclera: Conjunctivae normal.  Cardiovascular:     Rate and Rhythm: Normal rate.  Pulmonary:     Effort: Pulmonary effort is normal. No respiratory distress.     Comments: Breathing comfortably at rest, CTABL, no wheezing, rales or other adventitious sounds auscultated  Abdominal:     General: There is no distension.  Musculoskeletal:        General: Normal range of motion.     Cervical back: Neck supple.  Skin:    General: Skin is warm and dry.  Neurological:     Mental Status: She is alert and oriented to person, place, and time.      UC Treatments / Results  Labs (all labs ordered are listed, but only abnormal results are displayed) Labs Reviewed  SARS CORONAVIRUS 2 (TAT 6-24 HRS)    EKG   Radiology No results found.  Procedures Procedures (including critical care time)  Medications Ordered in UC Medications - No data to  display  Initial Impression /  Assessment and Plan / UC Course  I have reviewed the triage vital signs and the nursing notes.  Pertinent labs & imaging results that were available during my care of the patient were reviewed by me and considered in my medical decision making (see chart for details).     Covid test pending, mild URI symptoms with fatigue and diarrhea.  Recommending symptomatic and supportive care, push fluids.  Discussed strict return precautions. Patient verbalized understanding and is agreeable with plan.  Final Clinical Impressions(s) / UC Diagnoses   Final diagnoses:  Exposure to COVID-19 virus  Fatigue, unspecified type  Diarrhea, unspecified type     Discharge Instructions     Covid test pending, monitor my chart for results Rest and fluids Follow-up if any symptoms not improving or worsening    ED Prescriptions    None     PDMP not reviewed this encounter.   Janith Lima, [place] 04/20/20 1151

## 2020-04-20 LAB — SARS CORONAVIRUS 2 (TAT 6-24 HRS): SARS Coronavirus 2: NEGATIVE

## 2020-07-28 ENCOUNTER — Other Ambulatory Visit: Payer: Self-pay

## 2020-07-28 ENCOUNTER — Ambulatory Visit (HOSPITAL_COMMUNITY)
Admission: EM | Admit: 2020-07-28 | Discharge: 2020-07-28 | Disposition: A | Discharge status: Home / Self Care | Payer: 59 | Attending: Emergency Medicine | Admitting: Emergency Medicine

## 2020-07-28 ENCOUNTER — Encounter (HOSPITAL_COMMUNITY): Payer: Self-pay | Admitting: *Deleted

## 2020-07-28 DIAGNOSIS — J0100 Acute maxillary sinusitis, unspecified: Secondary | ICD-10-CM

## 2020-07-28 MED ORDER — AMOXICILLIN 875 MG PO TABS: 875.0000 mg | ORAL_TABLET | Freq: Two times a day (BID) | ORAL | 0 refills | Status: AC

## 2020-07-28 NOTE — Discharge Instructions (Signed)
Take the antibiotic as directed.  Follow up with your primary care provider if your symptoms are not improving.     

## 2020-07-28 NOTE — ED Triage Notes (Signed)
PT reports sinus infection for unknown time. Pt has not been seen for a sinus infection. Pt woke up today with redness to face and swelling around eyes.

## 2020-07-28 NOTE — ED Provider Notes (Signed)
[place]    CSN: [id number] Arrival date & time: 07/28/20  1737      History   Chief Complaint Chief Complaint  Patient presents with  . Facial Pain    HPI LAWSON Susan Coleman is a 59 y.o. female.   Patient presents with congestion, sinus pressure, postnasal drip, cough productive of green sputum x >1 week.  Today she woke up with redness and swelling under her eyes.  She denies fever, chills, shortness of breath, or other symptoms.  OTC treatment attempted at home.  Her medical history includes seasonal allergies.  Patient states she gets sinus infections 1-2 times a year.  The history is provided by the patient and medical records.    Past Medical History:  Diagnosis Date  . Diverticulosis of colon   . Internal hemorrhoid, bleeding   . Seasonal allergic rhinitis   . Wears partial dentures    upper    Patient Active Problem List   Diagnosis Date Noted  . Nasal congestion 01/21/2016  . Bacterial vaginitis 01/21/2016  . Allergic rhinitis 12/20/2015  . Healthcare maintenance 12/20/2015  . Acute sinusitis 12/20/2015    Past Surgical History:  Procedure Laterality Date  . COLONOSCOPY  last one 03-21-2019  dr Silverio Decamp  . LAPAROSCOPIC ABDOMINAL EXPLORATION  1986  . NASAL SINUS SURGERY  2019  . TRANSANAL HEMORRHOIDAL DEARTERIALIZATION N/A 06/02/2019   Procedure: TRANSANAL HEMORRHOIDAL DEARTERIALIZATION;  Surgeon: Leighton Ruff, MD;  Location: [facility];  Service: General;  Laterality: N/A;    OB History    Gravida  2   Para  2   Term  2   Preterm      AB      Living  2     SAB      TAB      Ectopic      Multiple      Live Births               Home Medications    Prior to Admission medications   Medication Sig Start Date End Date Taking? Authorizing Provider  Cyanocobalamin (VITAMIN B 12 PO) Take 1 tablet by mouth daily.   Yes [provider]  docusate sodium (COLACE) 100 MG capsule Take 1 capsule (100 mg  total) by mouth 3 (three) times daily. 03/05/42  Yes Leighton Ruff, MD  Multiple Vitamins-Minerals (ALIVE WOMENS 50+) CHEW Chew by mouth daily. 2 gummies daily    Yes [provider]  polyethylene glycol powder (MIRALAX) 17 GM/SCOOP powder Take 8.5-34 g by mouth 2 (two) times daily as needed. To correct constipation.  Adjust dose over 1-2 months.  Goal = ~1 bowel movement / day 1/54/00  Yes Leighton Ruff, MD  psyllium (METAMUCIL SMOOTH TEXTURE) 58.6 % powder Take 1 packet by mouth 3 (three) times daily. 8/67/61  Yes Leighton Ruff, MD  amoxicillin (AMOXIL) 875 MG tablet Take 1 tablet (875 mg total) by mouth 2 (two) times daily for 7 days. 07/28/20 08/04/20  Sharion Balloon, NP  levocetirizine (XYZAL) 5 MG tablet Take 5 mg by mouth every evening.    [provider]  oxyCODONE (OXY IR/ROXICODONE) 5 MG immediate release tablet Take 1-2 tablets (5-10 mg total) by mouth every 6 (six) hours as needed. 9/50/93   Leighton Ruff, MD  triamcinolone (NASACORT AQ) 55 MCG/ACT AERO nasal inhaler Place 2 sprays into the nose daily. Patient taking differently: Place 2 sprays into the nose daily.  11/02/16   Jodi Mourning  Redmond Pulling, MD    Family History Family History  Problem Relation Age of Onset  . Stroke Mother 76  . Cancer Mother        unsure what type  . High blood pressure Mother   . Breast cancer Half-Sister   . Colon cancer Neg Hx   . Esophageal cancer Neg Hx   . Stomach cancer Neg Hx   . Rectal cancer Neg Hx     Social History Social History   Tobacco Use  . Smoking status: Current Some Day Smoker    Years: 10.00    Types: Cigarettes  . Smokeless tobacco: Never Used  . Tobacco comment: 3 cigs per week  Vaping Use  . Vaping Use: Never used  Substance Use Topics  . Alcohol use: Yes    Comment: occasional  . Drug use: No     Allergies   Patient has no known allergies.   Review of Systems Review of Systems  Constitutional: Negative for chills and fever.  HENT:  Positive for congestion, postnasal drip, rhinorrhea and sinus pressure. Negative for ear pain and sore throat.   Eyes: Negative for pain and visual disturbance.  Respiratory: Positive for cough. Negative for shortness of breath.   Cardiovascular: Negative for chest pain and palpitations.  Gastrointestinal: Negative for abdominal pain, diarrhea and vomiting.  Genitourinary: Negative for dysuria and hematuria.  Musculoskeletal: Negative for arthralgias and back pain.  Skin: Negative for color change and rash.  Neurological: Negative for seizures and syncope.  All other systems reviewed and are negative.    Physical Exam Triage Vital Signs ED Triage Vitals  Enc Vitals Group     BP 07/28/20 1820 122/84     Pulse Rate 07/28/20 1820 88     Resp 07/28/20 1820 16     Temp 07/28/20 1820 98.3 F (36.8 C)     Temp Source 07/28/20 1820 Oral     SpO2 07/28/20 1820 100 %     Weight 07/28/20 1823 125 lb (56.7 kg)     Height 07/28/20 1823 5\' 2"  (1.575 m)     Head Circumference --      Peak Flow --      Pain Score 07/28/20 1822 10     Pain Loc --      Pain Edu? --      Excl. in [place]? --    No data found.  Updated Vital Signs BP 122/84 (BP Location: Right Arm)   Pulse 88   Temp 98.3 F (36.8 C) (Oral)   Resp 16   Ht 5\' 2"  (1.575 m)   Wt 125 lb (56.7 kg)   LMP 09/29/2011   SpO2 100%   BMI 22.86 kg/m   Visual Acuity Right Eye Distance:   Left Eye Distance:   Bilateral Distance:    Right Eye Near:   Left Eye Near:    Bilateral Near:     Physical Exam Vitals and nursing note reviewed.  Constitutional:      General: She is not in acute distress.    Appearance: She is well-developed.  HENT:     Head: Normocephalic and atraumatic.     Right Ear: Tympanic membrane normal.     Left Ear: Tympanic membrane normal.     Nose: Congestion present.     Mouth/Throat:     Mouth: Mucous membranes are moist.     Pharynx: Oropharynx is clear.  Eyes:     Conjunctiva/sclera: Conjunctivae  normal.  Cardiovascular:  Rate and Rhythm: Normal rate and regular rhythm.     Heart sounds: Normal heart sounds.  Pulmonary:     Effort: Pulmonary effort is normal. No respiratory distress.     Breath sounds: Normal breath sounds.  Abdominal:     Palpations: Abdomen is soft.     Tenderness: There is no abdominal tenderness. There is no guarding or rebound.  Musculoskeletal:     Cervical back: Neck supple.  Skin:    General: Skin is warm and dry.     Findings: No rash.  Neurological:     General: No focal deficit present.     Mental Status: She is alert and oriented to person, place, and time.     Gait: Gait normal.  Psychiatric:        Mood and Affect: Mood normal.        Behavior: Behavior normal.      UC Treatments / Results  Labs (all labs ordered are listed, but only abnormal results are displayed) Labs Reviewed - No data to display  EKG   Radiology No results found.  Procedures Procedures (including critical care time)  Medications Ordered in UC Medications - No data to display  Initial Impression / Assessment and Plan / UC Course  I have reviewed the triage vital signs and the nursing notes.  Pertinent labs & imaging results that were available during my care of the patient were reviewed by me and considered in my medical decision making (see chart for details).   Acute sinusitis.  Treating with amoxicillin.  Symptomatic treatment discussed.  Instructed patient to follow-up with her PCP if her symptoms are not improving.  Patient agrees to plan of care.   Final Clinical Impressions(s) / UC Diagnoses   Final diagnoses:  Acute non-recurrent maxillary sinusitis     Discharge Instructions     Take the antibiotic as directed.    Follow up with your primary care provider if your symptoms are not improving.       ED Prescriptions    Medication Sig Dispense Auth. Provider   amoxicillin (AMOXIL) 875 MG tablet Take 1 tablet (875 mg total) by mouth  2 (two) times daily for 7 days. 14 tablet Sharion Balloon, NP     PDMP not reviewed this encounter.   Sharion Balloon, NP 07/28/20 1850

## 2020-08-11 ENCOUNTER — Encounter (HOSPITAL_COMMUNITY): Payer: Self-pay

## 2020-08-11 ENCOUNTER — Other Ambulatory Visit: Payer: Self-pay

## 2020-08-11 ENCOUNTER — Ambulatory Visit (HOSPITAL_COMMUNITY)
Admission: EM | Admit: 2020-08-11 | Discharge: 2020-08-11 | Disposition: A | Discharge status: Home / Self Care | Payer: 59 | Attending: Family Medicine | Admitting: Family Medicine

## 2020-08-11 DIAGNOSIS — J0100 Acute maxillary sinusitis, unspecified: Secondary | ICD-10-CM

## 2020-08-11 MED ORDER — PREDNISONE 20 MG PO TABS: 40.0000 mg | ORAL_TABLET | Freq: Every day | ORAL | 0 refills | Status: DC

## 2020-08-11 MED ORDER — CEFDINIR 300 MG PO CAPS: 300.0000 mg | ORAL_CAPSULE | Freq: Two times a day (BID) | ORAL | 0 refills | Status: DC

## 2020-08-11 NOTE — ED Triage Notes (Signed)
Pt presents with nasal congestion, cough,  chills, headache x 1 week. Pt reports she was seen 2 weeks ago for sinus infection and was told to return if no improved.

## 2020-08-13 NOTE — ED Provider Notes (Signed)
[facility]   [id number] 08/11/20 Arrival Time: 0932  ASSESSMENT & PLAN:  1. Acute maxillary sinusitis, recurrence not specified    Begin: Meds ordered this encounter  Medications   cefdinir (OMNICEF) 300 MG capsule    Sig: Take 1 capsule (300 mg total) by mouth 2 (two) times daily.    Dispense:  20 capsule    Refill:  0   predniSONE (DELTASONE) 20 MG tablet    Sig: Take 2 tablets (40 mg total) by mouth daily.    Dispense:  10 tablet    Refill:  0    Discussed typical duration of symptoms. OTC symptom care as needed. Ensure adequate fluid intake and rest.  Recommend:  Follow-up Information    [facility].   Why: If worsening or failing to improve as anticipated. Contact information: [address] [place] [address] [phone]               Reviewed expectations re: course of current medical issues. Questions answered. Outlined signs and symptoms indicating need for more acute intervention. Patient verbalized understanding. After Visit Summary given.   SUBJECTIVE: History from: patient.  Susan Coleman is a 59 y.o. female who presents with complaint of nasal congestion, post-nasal drainage, and sinus pain. Finished tx for sinusitis 2 w ago; "now back; not sure if ever got better". Onset gradual, a week ago. Respiratory symptoms: none. Fever: absent. Overall normal PO intake without n/v. OTC treatment: none. Seasonal allergies: occasional; mild. History of frequent sinus infections: no. No specific aggravating or alleviating factors reported. Social History   Tobacco Use  Smoking Status Current Some Day Smoker   Years: 10.00   Types: Cigarettes  Smokeless Tobacco Never Used  Tobacco Comment   3 cigs per week     OBJECTIVE:  Vitals:   08/11/20 1757  BP: (!) 137/97  Pulse: 86  Resp: (!) 25  Temp: 98.5 F (36.9 C)  TempSrc: Oral  SpO2: 100%    Recheck RR: 18  General appearance:  alert; no distress HEENT: nasal congestion; clear runny nose; throat irritation secondary to post-nasal drainage; bilateral maxillary tenderness to palpation; turbinates boggy Neck: supple without LAD; trachea midline Lungs: unlabored respirations, symmetrical air entry; cough: absent; no respiratory distress Skin: warm and dry Psychological: alert and cooperative; normal mood and affect  No Known Allergies  Past Medical History:  Diagnosis Date   Diverticulosis of colon    Internal hemorrhoid, bleeding    Seasonal allergic rhinitis    Wears partial dentures    upper   Family History  Problem Relation Age of Onset   Stroke Mother 99   Cancer Mother        unsure what type   High blood pressure Mother    Breast cancer Half-Sister    Colon cancer Neg Hx    Esophageal cancer Neg Hx    Stomach cancer Neg Hx    Rectal cancer Neg Hx    Social History   Socioeconomic History   Marital status: Married    Spouse name: Not on file   Number of children: 2   Years of education: Not on file   Highest education level: Not on file  Occupational History   Occupation: Housekeeping  Tobacco Use   Smoking status: Current Some Day Smoker    Years: 10.00    Types: Cigarettes   Smokeless tobacco: Never Used   Tobacco comment: 3 cigs per week  Vaping Use   Vaping Use: Never used  Substance and Sexual Activity   Alcohol use: Yes    Comment: occasional   Drug use: No   Sexual activity: Yes    Partners: Male  Other Topics Concern   Not on file  Social History Narrative   Not on file   Social Determinants of Health   Financial Resource Strain:    Difficulty of Paying Living Expenses: Not on file  Food Insecurity:    Worried About [place] in the Last Year: Not on file   Ran Out of Food in the Last Year: Not on file  Transportation Needs:    Lack of Transportation (Medical): Not on file   Lack of Transportation (Non-Medical): Not on  file  Physical Activity:    Days of Exercise per Week: Not on file   Minutes of Exercise per Session: Not on file  Stress:    Feeling of Stress : Not on file  Social Connections:    Frequency of Communication with Friends and Family: Not on file   Frequency of Social Gatherings with Friends and Family: Not on file   Attends Religious Services: Not on file   Active Member of Clubs or Organizations: Not on file   Attends Archivist Meetings: Not on file   Marital Status: Not on file  Intimate Partner Violence:    Fear of Current or Ex-Partner: Not on file   Emotionally Abused: Not on file   Physically Abused: Not on file   Sexually Abused: Not on file            [place], MD 08/13/20 [phone]

## 2020-08-27 ENCOUNTER — Encounter (INDEPENDENT_AMBULATORY_CARE_PROVIDER_SITE_OTHER): Payer: Self-pay | Admitting: Primary Care

## 2020-08-27 ENCOUNTER — Ambulatory Visit (INDEPENDENT_AMBULATORY_CARE_PROVIDER_SITE_OTHER): Payer: 59 | Admitting: Primary Care

## 2020-08-27 VITALS — BP 116/80 | HR 94 | Temp 97.2°F | Ht 64.0 in | Wt 123.6 lb

## 2020-08-27 DIAGNOSIS — Z23 Encounter for immunization: Secondary | ICD-10-CM | POA: Diagnosis not present

## 2020-08-27 DIAGNOSIS — Z09 Encounter for follow-up examination after completed treatment for conditions other than malignant neoplasm: Secondary | ICD-10-CM

## 2020-08-27 DIAGNOSIS — Z131 Encounter for screening for diabetes mellitus: Secondary | ICD-10-CM | POA: Diagnosis not present

## 2020-08-27 DIAGNOSIS — Z7689 Persons encountering health services in other specified circumstances: Secondary | ICD-10-CM | POA: Diagnosis not present

## 2020-08-27 DIAGNOSIS — J320 Chronic maxillary sinusitis: Secondary | ICD-10-CM

## 2020-08-27 LAB — POCT GLYCOSYLATED HEMOGLOBIN (HGB A1C): Hemoglobin A1C: 5.3 % (ref 4.0–5.6)

## 2020-08-27 MED ORDER — FEXOFENADINE-PSEUDOEPHED ER 180-240 MG PO TB24: 1.0000 | ORAL_TABLET | Freq: Every day | ORAL | 2 refills | Status: DC

## 2020-08-27 NOTE — Progress Notes (Signed)
New Patient Office Visit  Subjective:  Patient ID: Susan Coleman, female    DOB: 02-28-1961  Age: 59 y.o. MRN: [id number]  CC:  Chief Complaint  Patient presents with   New Patient (Initial Visit)    sinus issues     HPI  [name] is a 59 year old female who presents for establishment of care.and  follow up from the Urgent care.  Discharged from the Urgent care  on  The same day 08/11/20, patient was evaluated for  for: Acute maxillary sinusitis, recurrence not specified. [facility] Acute non-recurrent maxillary sinusitis and treated with antibiotics. Continues to have cough  congestion, postnasal drip, rhinorrhea and sinus pressure  Past Medical History:  Diagnosis Date   Diverticulosis of colon    Internal hemorrhoid, bleeding    Seasonal allergic rhinitis    Wears partial dentures    upper     No Known Allergies    Current Outpatient Medications on File Prior to Visit  Medication Sig Dispense Refill   cefdinir (OMNICEF) 300 MG capsule Take 1 capsule (300 mg total) by mouth 2 (two) times daily. 20 capsule 0   Cyanocobalamin (VITAMIN B 12 PO) Take 1 tablet by mouth daily.     estradiol (ESTRACE) 0.5 MG tablet estradiol 0.5 mg tablet  TAKE 1 TABLET BY MOUTH EVERY DAY     Multiple Vitamins-Minerals (ALIVE WOMENS 50+) CHEW Chew by mouth daily. 2 gummies daily      psyllium (METAMUCIL SMOOTH TEXTURE) 58.6 % powder Take 1 packet by mouth 3 (three) times daily. 283 g 12   triamcinolone (NASACORT AQ) 55 MCG/ACT AERO nasal inhaler Place 2 sprays into the nose daily. (Patient taking differently: Place 2 sprays into the nose daily. ) 1 Inhaler 0   [DISCONTINUED] levocetirizine (XYZAL) 5 MG tablet Take 5 mg by mouth every evening.     No current facility-administered medications on file prior to visit.    ROS: all negative except above.   Physical Exam: Filed Weights   08/27/20 0911  Weight: 123 lb 9.6 oz (56.1 kg)   BP 116/80 (BP  Location: Right Arm, Patient Position: Sitting, Cuff Size: Normal)    Pulse 94    Temp (!) 97.2 F (36.2 C) (Temporal)    Ht 5\' 4"  (1.626 m)    Wt 123 lb 9.6 oz (56.1 kg)    LMP 09/29/2011    SpO2 100%    BMI 21.22 kg/m  General Appearance: Well nourished, in no apparent distress. Eyes: PERRLA, EOMs, conjunctiva no swelling or erythema Sinuses: No Frontal/maxillary tenderness ENT/Mouth: Ext aud canals clear, TMs without erythema, bulging.  Tonsils not swollen or erythematous. Hearing normal.  Neck: Supple, thyroid normal.  Respiratory: Respiratory effort normal, BS equal bilaterally without rales, rhonchi, wheezing or stridor.  Cardio: RRR with no MRGs. Brisk peripheral pulses without edema.  Abdomen: Soft, + BS.  Non tender, no guarding, rebound, hernias, masses. Lymphatics: Non tender without lymphadenopathy.  Musculoskeletal: Full ROM, 5/5 strength, normal gait.  Skin: Warm, dry without rashes, lesions, ecchymosis.  Neuro: Cranial nerves intact. Normal muscle tone, no cerebellar symptoms. Sensation intact.  Psych: Awake and oriented X 3, normal affect, Insight and Judgment appropriate.     [name] was seen today for new patient (initial visit).  Diagnoses and all orders for this visit:  Encounter to establish care Establishing care with new provider - Per insurance needed a provider in network   Need for Tdap vaccination -  Tdap vaccine greater than or equal to 7yo IM  Screening for diabetes mellitus -     HgB A1c 5.3 per ADA guidelines not pre/diabetic   Hospital discharge follow-up Discharged from the Urgent care  on  The same day 08/11/20, patient was evaluated for  for: Acute maxillary sinusitis, recurrence not specified. [facility] Acute non-recurrent maxillary sinusitis and treated with antibiotics  Chronic maxillary sinusitis -     Ambulatory referral to ENT Prescribed Allegra D 24/hrs #30    Follow-up: Return for schedule pap.   Kerin Perna, NP

## 2020-08-27 NOTE — Patient Instructions (Addendum)
https://www.cdc.gov/vaccines/hcp/vis/vis-statements/tdap.pdf">  Tdap (Tetanus, Diphtheria, Pertussis) Vaccine: What You Need to Know 1. Why get vaccinated? Tdap vaccine can prevent tetanus, diphtheria, and pertussis. Diphtheria and pertussis spread from person to person. Tetanus enters the body through cuts or wounds.  TETANUS (T) causes painful stiffening of the muscles. Tetanus can lead to serious health problems, including being unable to open the mouth, having trouble swallowing and breathing, or death.  DIPHTHERIA (D) can lead to difficulty breathing, heart failure, paralysis, or death.  PERTUSSIS (aP), also known as "whooping cough," can cause uncontrollable, violent coughing which makes it hard to breathe, eat, or drink. Pertussis can be extremely serious in babies and young children, causing pneumonia, convulsions, brain damage, or death. In teens and adults, it can cause weight loss, loss of bladder control, passing out, and rib fractures from severe coughing. 2. Tdap vaccine Tdap is only for children 7 years and older, adolescents, and adults.  Adolescents should receive a single dose of Tdap, preferably at age 53 or 35 years. Pregnant women should get a dose of Tdap during every pregnancy, to protect the newborn from pertussis. Infants are most at risk for severe, life-threatening complications from pertussis. Adults who have never received Tdap should get a dose of Tdap. Also, adults should receive a booster dose every 10 years, or earlier in the case of a severe and dirty wound or burn. Booster doses can be either Tdap or Td (a different vaccine that protects against tetanus and diphtheria but not pertussis). Tdap may be given at the same time as other vaccines. 3. Talk with your health care provider Tell your vaccine provider if the person getting the vaccine:  Has had an allergic reaction after a previous dose of any vaccine that protects against tetanus, diphtheria, or pertussis,  or has any severe, life-threatening allergies.  Has had a coma, decreased level of consciousness, or prolonged seizures within 7 days after a previous dose of any pertussis vaccine (DTP, DTaP, or Tdap).  Has seizures or another nervous system problem.  Has ever had Guillain-Barr Syndrome (also called GBS).  Has had severe pain or swelling after a previous dose of any vaccine that protects against tetanus or diphtheria. In some cases, your health care provider may decide to postpone Tdap vaccination to a future visit.  People with minor illnesses, such as a cold, may be vaccinated. People who are moderately or severely ill should usually wait until they recover before getting Tdap vaccine.  Your health care provider can give you more information. 4. Risks of a vaccine reaction  Pain, redness, or swelling where the shot was given, mild fever, headache, feeling tired, and nausea, vomiting, diarrhea, or stomachache sometimes happen after Tdap vaccine. People sometimes faint after medical procedures, including vaccination. Tell your provider if you feel dizzy or have vision changes or ringing in the ears.  As with any medicine, there is a very remote chance of a vaccine causing a severe allergic reaction, other serious injury, or death. 5. What if there is a serious problem? An allergic reaction could occur after the vaccinated person leaves the clinic. If you see signs of a severe allergic reaction (hives, swelling of the face and throat, difficulty breathing, a fast heartbeat, dizziness, or weakness), call 9-1-1 and get the person to the nearest hospital. For other signs that concern you, call your health care provider.  Adverse reactions should be reported to the Vaccine Adverse Event Reporting System (VAERS). Your health care provider will usually file this report,  or you can do it yourself. Visit the VAERS website at www.vaers.SamedayNews.es or call (937)377-9257. VAERS is only for reporting  reactions, and VAERS staff do not give medical advice. 6. The National Vaccine Injury Compensation Program The [employer] Vaccine Injury Compensation Program (VICP) is a federal program that was created to compensate people who may have been injured by certain vaccines. Visit the VICP website at GoldCloset.com.ee or call (424)417-5510 to learn about the program and about filing a claim. There is a time limit to file a claim for compensation. 7. How can I learn more?  Ask your health care provider.  Call your local or state health department.  Contact the Centers for Disease Control and Prevention (CDC): ? Call 671-232-7284 (1-800-CDC-INFO) or ? Visit CDC's website at http://hunter.com/ Vaccine Information Statement Tdap (Tetanus, Diphtheria, Pertussis) Vaccine (12/22/2018) This information is not intended to replace advice given to you by your health care provider. Make sure you discuss any questions you have with your health care provider. Document Revised: 12/31/2018 Document Reviewed: 01/03/2019 Elsevier Patient Education  2020 [place]. Influenza, Adult Influenza is also called "the flu." It is an infection in the lungs, nose, and throat (respiratory tract). It is caused by a virus. The flu causes symptoms that are similar to symptoms of a cold. It also causes a high fever and body aches. The flu spreads easily from person to person (is contagious). Getting a flu shot (influenza vaccination) every year is the best way to prevent the flu. What are the causes? This condition is caused by the influenza virus. You can get the virus by:  Breathing in droplets that are in the air from the cough or sneeze of a person who has the virus.  Touching something that has the virus on it (is contaminated) and then touching your mouth, nose, or eyes. What increases the risk? Certain things may make you more likely to get the flu. These include:  Not washing your hands often.   Having close contact with many people during cold and flu season.  Touching your mouth, eyes, or nose without first washing your hands.  Not getting a flu shot every year. You may have a higher risk for the flu, along with serious problems such as a lung infection (pneumonia), if you:  Are older than 65.  Are pregnant.  Have a weakened disease-fighting system (immune system) because of a disease or taking certain medicines.  Have a long-term (chronic) illness, such as: ? Heart, kidney, or lung disease. ? Diabetes. ? Asthma.  Have a liver disorder.  Are very overweight (morbidly obese).  Have anemia. This is a condition that affects your red blood cells. What are the signs or symptoms? Symptoms usually begin suddenly and last 4-14 days. They may include:  Fever and chills.  Headaches, body aches, or muscle aches.  Sore throat.  Cough.  Runny or stuffy (congested) nose.  Chest discomfort.  Not wanting to eat as much as normal (poor appetite).  Weakness or feeling tired (fatigue).  Dizziness.  Feeling sick to your stomach (nauseous) or throwing up (vomiting). How is this treated? If the flu is found early, you can be treated with medicine that can help reduce how bad the illness is and how long it lasts (antiviral medicine). This may be given by mouth (orally) or through an IV tube. Taking care of yourself at home can help your symptoms get better. Your doctor may suggest:  Taking over-the-counter medicines.  Drinking plenty of fluids. The  flu often goes away on its own. If you have very bad symptoms or other problems, you may be treated in a hospital. Follow these instructions at home:     Activity  Rest as needed. Get plenty of sleep.  Stay home from work or school as told by your doctor. ? Do not leave home until you do not have a fever for 24 hours without taking medicine. ? Leave home only to visit your doctor. Eating and drinking  Take an ORS (oral  rehydration solution). This is a drink that is sold at pharmacies and stores.  Drink enough fluid to keep your pee (urine) pale yellow.  Drink clear fluids in small amounts as you are able. Clear fluids include: ? Water. ? Ice chips. ? Fruit juice that has water added (diluted fruit juice). ? Low-calorie sports drinks.  Eat bland, easy-to-digest foods in small amounts as you are able. These foods include: ? Bananas. ? Applesauce. ? Rice. ? Lean meats. ? Toast. ? Crackers.  Do not eat or drink: ? Fluids that have a lot of sugar or caffeine. ? Alcohol. ? Spicy or fatty foods. General instructions  Take over-the-counter and prescription medicines only as told by your doctor.  Use a cool mist humidifier to add moisture to the air in your home. This can make it easier for you to breathe.  Cover your mouth and nose when you cough or sneeze.  Wash your hands with soap and water often, especially after you cough or sneeze. If you cannot use soap and water, use alcohol-based hand sanitizer.  Keep all follow-up visits as told by your doctor. This is important. How is this prevented?   Get a flu shot every year. You may get the flu shot in late summer, fall, or winter. Ask your doctor when you should get your flu shot.  Avoid contact with people who are sick during fall and winter (cold and flu season). Contact a doctor if:  You get new symptoms.  You have: ? Chest pain. ? Watery poop (diarrhea). ? A fever.  Your cough gets worse.  You start to have more mucus.  You feel sick to your stomach.  You throw up. Get help right away if you:  Have shortness of breath.  Have trouble breathing.  Have skin or nails that turn a bluish color.  Have very bad pain or stiffness in your neck.  Get a sudden headache.  Get sudden pain in your face or ear.  Cannot eat or drink without throwing up. Summary  Influenza ("the flu") is an infection in the lungs, nose, and throat.  It is caused by a virus.  Take over-the-counter and prescription medicines only as told by your doctor.  Getting a flu shot every year is the best way to avoid getting the flu. This information is not intended to replace advice given to you by your health care provider. Make sure you discuss any questions you have with your health care provider. Document Revised: 02/24/2018 Document Reviewed: 02/24/2018 Elsevier Patient Education  [place].  Sinusitis, Adult Sinusitis is soreness and swelling (inflammation) of your sinuses. Sinuses are hollow spaces in the bones around your face. They are located:  Around your eyes.  In the middle of your forehead.  Behind your nose.  In your cheekbones. Your sinuses and nasal passages are lined with a fluid called mucus. Mucus drains out of your sinuses. Swelling can trap mucus in your sinuses. This lets germs (bacteria,  virus, or fungus) grow, which leads to infection. Most of the time, this condition is caused by a virus. What are the causes? This condition is caused by:  Allergies.  Asthma.  Germs.  Things that block your nose or sinuses.  Growths in the nose (nasal polyps).  Chemicals or irritants in the air.  Fungus (rare). What increases the risk? You are more likely to develop this condition if:  You have a weak body defense system (immune system).  You do a lot of swimming or diving.  You use nasal sprays too much.  You smoke. What are the signs or symptoms? The main symptoms of this condition are pain and a feeling of pressure around the sinuses. Other symptoms include:  Stuffy nose (congestion).  Runny nose (drainage).  Swelling and warmth in the sinuses.  Headache.  Toothache.  A cough that may get worse at night.  Mucus that collects in the throat or the back of the nose (postnasal drip).  Being unable to smell and taste.  Being very tired (fatigue).  A fever.  Sore throat.  Bad breath.  How is this diagnosed? This condition is diagnosed based on:  Your symptoms.  Your medical history.  A physical exam.  Tests to find out if your condition is short-term (acute) or long-term (chronic). Your doctor may: ? Check your nose for growths (polyps). ? Check your sinuses using a tool that has a light (endoscope). ? Check for allergies or germs. ? Do imaging tests, such as an MRI or CT scan. How is this treated? Treatment for this condition depends on the cause and whether it is short-term or long-term.  If caused by a virus, your symptoms should go away on their own within 10 days. You may be given medicines to relieve symptoms. They include: ? Medicines that shrink swollen tissue in the nose. ? Medicines that treat allergies (antihistamines). ? A spray that treats swelling of the nostrils. ? Rinses that help get rid of thick mucus in your nose (nasal saline washes).  If caused by bacteria, your doctor may wait to see if you will get better without treatment. You may be given antibiotic medicine if you have: ? A very bad infection. ? A weak body defense system.  If caused by growths in the nose, you may need to have surgery. Follow these instructions at home: Medicines  Take, use, or apply over-the-counter and prescription medicines only as told by your doctor. These may include nasal sprays.  If you were prescribed an antibiotic medicine, take it as told by your doctor. Do not stop taking the antibiotic even if you start to feel better. Hydrate and humidify   Drink enough water to keep your pee (urine) pale yellow.  Use a cool mist humidifier to keep the humidity level in your home above 50%.  Breathe in steam for 10-15 minutes, 3-4 times a day, or as told by your doctor. You can do this in the bathroom while a hot shower is running.  Try not to spend time in cool or dry air. Rest  Rest as much as you can.  Sleep with your head raised (elevated).  Make sure  you get enough sleep each night. General instructions   Put a warm, moist washcloth on your face 3-4 times a day, or as often as told by your doctor. This will help with discomfort.  Wash your hands often with soap and water. If there is no soap and water, use hand  sanitizer.  Do not smoke. Avoid being around people who are smoking (secondhand smoke).  Keep all follow-up visits as told by your doctor. This is important. Contact a doctor if:  You have a fever.  Your symptoms get worse.  Your symptoms do not get better within 10 days. Get help right away if:  You have a very bad headache.  You cannot stop throwing up (vomiting).  You have very bad pain or swelling around your face or eyes.  You have trouble seeing.  You feel confused.  Your neck is stiff.  You have trouble breathing. Summary  Sinusitis is swelling of your sinuses. Sinuses are hollow spaces in the bones around your face.  This condition is caused by tissues in your nose that become inflamed or swollen. This traps germs. These can lead to infection.  If you were prescribed an antibiotic medicine, take it as told by your doctor. Do not stop taking it even if you start to feel better.  Keep all follow-up visits as told by your doctor. This is important. This information is not intended to replace advice given to you by your health care provider. Make sure you discuss any questions you have with your health care provider. Document Revised: 02/08/2018 Document Reviewed: 02/08/2018 Elsevier Patient Education  [place].

## 2020-08-28 LAB — CBC WITH DIFFERENTIAL/PLATELET
Basophils Absolute: 0.1 10*3/uL (ref 0.0–0.2)
Basos: 1 %
EOS (ABSOLUTE): 0.3 10*3/uL (ref 0.0–0.4)
Eos: 3 %
Hematocrit: 41.2 % (ref 34.0–46.6)
Hemoglobin: 13.8 g/dL (ref 11.1–15.9)
Immature Grans (Abs): 0 10*3/uL (ref 0.0–0.1)
Immature Granulocytes: 0 %
Lymphocytes Absolute: 2.5 10*3/uL (ref 0.7–3.1)
Lymphs: 31 %
MCH: 32 pg (ref 26.6–33.0)
MCHC: 33.5 g/dL (ref 31.5–35.7)
MCV: 96 fL (ref 79–97)
Monocytes Absolute: 0.7 10*3/uL (ref 0.1–0.9)
Monocytes: 9 %
Neutrophils Absolute: 4.7 10*3/uL (ref 1.4–7.0)
Neutrophils: 56 %
Platelets: 249 10*3/uL (ref 150–450)
RBC: 4.31 x10E6/uL (ref 3.77–5.28)
RDW: 13.2 % (ref 11.7–15.4)
WBC: 8.2 10*3/uL (ref 3.4–10.8)

## 2020-08-28 LAB — CMP14+EGFR
ALT: 13 IU/L (ref 0–32)
AST: 20 IU/L (ref 0–40)
Albumin/Globulin Ratio: 1.4 (ref 1.2–2.2)
Albumin: 4.3 g/dL (ref 3.8–4.9)
Alkaline Phosphatase: 80 IU/L (ref 44–121)
BUN/Creatinine Ratio: 13 (ref 9–23)
BUN: 13 mg/dL (ref 6–24)
Bilirubin Total: 0.2 mg/dL (ref 0.0–1.2)
CO2: 24 mmol/L (ref 20–29)
Calcium: 9.7 mg/dL (ref 8.7–10.2)
Chloride: 105 mmol/L (ref 96–106)
Creatinine, Ser: 0.98 mg/dL (ref 0.57–1.00)
GFR calc Af Amer: 73 mL/min/{1.73_m2} (ref >59)
GFR calc non Af Amer: 63 mL/min/{1.73_m2} (ref >59)
Globulin, Total: 3.1 g/dL (ref 1.5–4.5)
Glucose: 93 mg/dL (ref 65–99)
Potassium: 4.1 mmol/L (ref 3.5–5.2)
Sodium: 145 mmol/L — ABNORMAL HIGH (ref 134–144)
Total Protein: 7.4 g/dL (ref 6.0–8.5)

## 2020-09-04 ENCOUNTER — Encounter (INDEPENDENT_AMBULATORY_CARE_PROVIDER_SITE_OTHER): Payer: Self-pay | Admitting: Primary Care

## 2020-09-04 ENCOUNTER — Ambulatory Visit (INDEPENDENT_AMBULATORY_CARE_PROVIDER_SITE_OTHER): Payer: 59 | Admitting: Primary Care

## 2020-09-04 ENCOUNTER — Other Ambulatory Visit: Payer: Self-pay

## 2020-09-04 ENCOUNTER — Other Ambulatory Visit (HOSPITAL_COMMUNITY)
Admission: RE | Admit: 2020-09-04 | Discharge: 2020-09-04 | Disposition: A | Discharge status: Home / Self Care | Payer: 59 | Source: Ambulatory Visit | Attending: Primary Care | Admitting: Primary Care

## 2020-09-04 VITALS — BP 122/86 | HR 101 | Temp 97.3°F | Ht 62.0 in | Wt 124.0 lb

## 2020-09-04 DIAGNOSIS — Z113 Encounter for screening for infections with a predominantly sexual mode of transmission: Secondary | ICD-10-CM | POA: Insufficient documentation

## 2020-09-04 DIAGNOSIS — Z1231 Encounter for screening mammogram for malignant neoplasm of breast: Secondary | ICD-10-CM

## 2020-09-04 DIAGNOSIS — Z124 Encounter for screening for malignant neoplasm of cervix: Secondary | ICD-10-CM

## 2020-09-04 NOTE — Patient Instructions (Addendum)

## 2020-09-04 NOTE — Progress Notes (Signed)
Established Patient Office Visit  Subjective:  Patient ID: Susan Coleman, female    DOB: 05/28/1961  Age: 59 y.o. MRN: [id number]  CC:  Chief Complaint  Patient presents with  . Gynecologic Exam    HPI [name] is a 59 year old female presents for gyn exam. She voices no problems or concerns   Past Medical History:  Diagnosis Date  . Diverticulosis of colon   . Internal hemorrhoid, bleeding   . Seasonal allergic rhinitis   . Wears partial dentures    upper    Past Surgical History:  Procedure Laterality Date  . COLONOSCOPY  last one 03-21-2019  dr Silverio Decamp  . LAPAROSCOPIC ABDOMINAL EXPLORATION  1986  . NASAL SINUS SURGERY  2019  . TRANSANAL HEMORRHOIDAL DEARTERIALIZATION N/A 06/02/2019   Procedure: TRANSANAL HEMORRHOIDAL DEARTERIALIZATION;  Surgeon: Leighton Ruff, MD;  Location: [facility];  Service: General;  Laterality: N/A;    Family History  Problem Relation Age of Onset  . Stroke Mother 54  . Cancer Mother        unsure what type  . High blood pressure Mother   . Breast cancer Half-Sister   . Colon cancer Neg Hx   . Esophageal cancer Neg Hx   . Stomach cancer Neg Hx   . Rectal cancer Neg Hx     Social History   Socioeconomic History  . Marital status: Married    Spouse name: Not on file  . Number of children: 2  . Years of education: Not on file  . Highest education level: Not on file  Occupational History  . Occupation: Housekeeping  Tobacco Use  . Smoking status: Current Some Day Smoker    Years: 10.00    Types: Cigarettes  . Smokeless tobacco: Never Used  . Tobacco comment: 3 cigs per week  Vaping Use  . Vaping Use: Never used  Substance and Sexual Activity  . Alcohol use: Yes    Comment: occasional  . Drug use: No  . Sexual activity: Yes    Partners: Male  Other Topics Concern  . Not on file  Social History Narrative  . Not on file   Social Determinants of Health   Financial Resource Strain: Not on file   Food Insecurity: Not on file  Transportation Needs: Not on file  Physical Activity: Not on file  Stress: Not on file  Social Connections: Not on file  Intimate Partner Violence: Not on file    Outpatient Medications Prior to Visit  Medication Sig Dispense Refill  . fexofenadine-pseudoephedrine (ALLEGRA-D 24) 180-240 MG 24 hr tablet Take 1 tablet by mouth daily. 30 tablet 2  . Multiple Vitamins-Minerals (ALIVE WOMENS 50+) CHEW Chew by mouth daily. 2 gummies daily    . psyllium (METAMUCIL SMOOTH TEXTURE) 58.6 % powder Take 1 packet by mouth 3 (three) times daily. 283 g 12  . triamcinolone (NASACORT AQ) 55 MCG/ACT AERO nasal inhaler Place 2 sprays into the nose daily. (Patient taking differently: Place 2 sprays into the nose daily.) 1 Inhaler 0  . Cyanocobalamin (VITAMIN B 12 PO) Take 1 tablet by mouth daily.    Marland Kitchen estradiol (ESTRACE) 0.5 MG tablet estradiol 0.5 mg tablet  TAKE 1 TABLET BY MOUTH EVERY DAY    . cefdinir (OMNICEF) 300 MG capsule Take 1 capsule (300 mg total) by mouth 2 (two) times daily. 20 capsule 0   No facility-administered medications prior to visit.    No Known Allergies  ROS  Review of Systems  All other systems reviewed and are negative.     Objective:    BP 122/86 (BP Location: Right Arm, Patient Position: Sitting, Cuff Size: Normal)   Pulse (!) 101   Temp (!) 97.3 F (36.3 C) (Temporal)   Ht 5\' 2"  (1.575 m)   Wt 124 lb (56.2 kg)   LMP 09/29/2011   SpO2 100%   BMI 22.68 kg/m  Wt Readings from Last 3 Encounters:  09/04/20 124 lb (56.2 kg)  08/27/20 123 lb 9.6 oz (56.1 kg)  07/28/20 125 lb (56.7 kg)   Physical Exam CONSTITUTIONAL: Well-developed, well-nourished thin frame  female in no acute distress.  HENT:  Normocephalic, atraumatic, External right and left ear normal.  EYES: Conjunctivae and EOM are normal. Pupils are equal, round, and reactive to light. No scleral icterus.  NECK: Normal range of motion, supple, no masses.  Normal thyroid.   SKIN: Skin is warm and dry. No rash noted. Not diaphoretic. No erythema. No pallor. [place]: Alert and oriented to person, place, and time. Normal reflexes, muscle tone coordination. No cranial nerve deficit noted. PSYCHIATRIC: Normal mood and affect. Normal behavior. Normal judgment and thought content. CARDIOVASCULAR: Normal heart rate noted, regular rhythm RESPIRATORY: Clear to auscultation bilaterally. Effort and breath sounds normal, no problems with respiration noted. BREASTS: Taught SBE and rtn demonstration ABDOMEN: Soft, normal bowel sounds, no distention noted.  No tenderness, rebound or guarding.  PELVIC: Normal appearing external genitalia; normal appearing vaginal mucosa and cervix.  No abnormal discharge noted.  Pap smear obtained.  Normal uterine size, no other palpable masses, no uterine or adnexal tenderness. MUSCULOSKELETAL: Normal range of motion. No tenderness.  No cyanosis, clubbing, or edema.  2+ distal pulses. Health Maintenance Due  Topic Date Due  . PAP SMEAR-Modifier  07/29/2018    There are no preventive care reminders to display for this patient.  No results found for: TSH Lab Results  Component Value Date   WBC 8.2 08/27/2020   HGB 13.8 08/27/2020   HCT 41.2 08/27/2020   MCV 96 08/27/2020   PLT 249 08/27/2020   Lab Results  Component Value Date   NA 145 (H) 08/27/2020   K 4.1 08/27/2020   CO2 24 08/27/2020   GLUCOSE 93 08/27/2020   BUN 13 08/27/2020   CREATININE 0.98 08/27/2020   BILITOT 0.2 08/27/2020   ALKPHOS 80 08/27/2020   AST 20 08/27/2020   ALT 13 08/27/2020   PROT 7.4 08/27/2020   ALBUMIN 4.3 08/27/2020   CALCIUM 9.7 08/27/2020   Lab Results  Component Value Date   CHOL 172 01/21/2016   Lab Results  Component Value Date   HDL 63 01/21/2016   Lab Results  Component Value Date   LDLCALC 80 01/21/2016   Lab Results  Component Value Date   TRIG 145 01/21/2016   Lab Results  Component Value Date   CHOLHDL 2.7 01/21/2016    Lab Results  Component Value Date   HGBA1C 5.3 08/27/2020      Assessment & Plan:  Susan Coleman was seen today for gynecologic exam.  Diagnoses and all orders for this visit:  Susan Coleman was seen today for gynecologic exam.  Diagnoses and all orders for this visit:  Cervical cancer screening -     Cytology - PAP([place])  Screening examination for STD (sexually transmitted disease) -     Cervicovaginal ancillary only  Encounter for screening mammogram for malignant neoplasm of breast -     MM Digital Diagnostic  [place]; Future   No orders of the defined types were placed in this encounter.   Follow-up: No follow-ups on file.    Kerin Perna, NP

## 2020-09-06 LAB — CERVICOVAGINAL ANCILLARY ONLY
Bacterial Vaginitis (gardnerella): POSITIVE — AB
Candida Glabrata: NEGATIVE
Candida Vaginitis: NEGATIVE
Chlamydia: NEGATIVE
Comment: NEGATIVE
Comment: NEGATIVE
Comment: NEGATIVE
Comment: NEGATIVE
Comment: NEGATIVE
Comment: NORMAL
Neisseria Gonorrhea: NEGATIVE
Trichomonas: NEGATIVE

## 2020-09-07 LAB — CYTOLOGY - PAP
Comment: NEGATIVE
Diagnosis: NEGATIVE
High risk HPV: NEGATIVE

## 2020-09-11 ENCOUNTER — Telehealth: Payer: Self-pay | Admitting: Primary Care

## 2020-09-11 NOTE — Telephone Encounter (Signed)
Please send medication for BV.

## 2020-09-11 NOTE — Telephone Encounter (Signed)
Copied from [place] [phone]. Topic: General - Inquiry >> Sep 11, 2020  3:02 PM Greggory Keen D wrote: Reason for CRM: Pt called saying she had some labs done last Tuesday and she would like a call back with the results  CB#  [phone]

## 2020-09-12 ENCOUNTER — Other Ambulatory Visit (INDEPENDENT_AMBULATORY_CARE_PROVIDER_SITE_OTHER): Payer: Self-pay | Admitting: Primary Care

## 2020-09-12 DIAGNOSIS — B9689 Other specified bacterial agents as the cause of diseases classified elsewhere: Secondary | ICD-10-CM

## 2020-09-12 DIAGNOSIS — N760 Acute vaginitis: Secondary | ICD-10-CM

## 2020-09-12 MED ORDER — METRONIDAZOLE 500 MG PO TABS: 500.0000 mg | ORAL_TABLET | Freq: Two times a day (BID) | ORAL | 0 refills | Status: DC

## 2020-09-27 ENCOUNTER — Encounter (INDEPENDENT_AMBULATORY_CARE_PROVIDER_SITE_OTHER): Payer: Self-pay | Admitting: Otolaryngology

## 2020-09-27 ENCOUNTER — Ambulatory Visit (INDEPENDENT_AMBULATORY_CARE_PROVIDER_SITE_OTHER): Payer: 59 | Admitting: Otolaryngology

## 2020-09-27 ENCOUNTER — Other Ambulatory Visit: Payer: Self-pay

## 2020-09-27 VITALS — Temp 97.7°F

## 2020-09-27 DIAGNOSIS — J329 Chronic sinusitis, unspecified: Secondary | ICD-10-CM

## 2020-09-27 NOTE — Progress Notes (Signed)
HPI: Susan Coleman is a 60 y.o. female who presents is referred by Gwinda Passe, NP for evaluation of chronic sinus problems..  She apparently had sinus surgery with Dr. Pollyann Kennedy performed over a year ago.  She has continued to have problems with a lot of thick mucus in her nose as well as recurrent sinus infections.  She is referred here for further evaluation and recommendations.  She has been on for 5 rounds of antibiotics since surgery.  Past Medical History:  Diagnosis Date  . Diverticulosis of colon   . Internal hemorrhoid, bleeding   . Seasonal allergic rhinitis   . Wears partial dentures    upper   Past Surgical History:  Procedure Laterality Date  . COLONOSCOPY  last one 03-21-2019  dr Lavon Paganini  . LAPAROSCOPIC ABDOMINAL EXPLORATION  1986  . NASAL SINUS SURGERY  2019  . TRANSANAL HEMORRHOIDAL DEARTERIALIZATION N/A 06/02/2019   Procedure: TRANSANAL HEMORRHOIDAL DEARTERIALIZATION;  Surgeon: Romie Levee, MD;  Location: [facility];  Service: General;  Laterality: N/A;   Social History   Socioeconomic History  . Marital status: Married    Spouse name: Not on file  . Number of children: 2  . Years of education: Not on file  . Highest education level: Not on file  Occupational History  . Occupation: Housekeeping  Tobacco Use  . Smoking status: Current Some Day Smoker    Years: 10.00    Types: Cigarettes  . Smokeless tobacco: Never Used  . Tobacco comment: 3 cigs per week  Vaping Use  . Vaping Use: Never used  Substance and Sexual Activity  . Alcohol use: Yes    Comment: occasional  . Drug use: No  . Sexual activity: Yes    Partners: Male  Other Topics Concern  . Not on file  Social History Narrative  . Not on file   Social Determinants of Health   Financial Resource Strain: Not on file  Food Insecurity: Not on file  Transportation Needs: Not on file  Physical Activity: Not on file  Stress: Not on file  Social Connections: Not on file    Family History  Problem Relation Age of Onset  . Stroke Mother 58  . Cancer Mother        unsure what type  . High blood pressure Mother   . Breast cancer Half-Sister   . Colon cancer Neg Hx   . Esophageal cancer Neg Hx   . Stomach cancer Neg Hx   . Rectal cancer Neg Hx    No Known Allergies Prior to Admission medications   Medication Sig Start Date End Date Taking? Authorizing Provider  Cyanocobalamin (VITAMIN B 12 PO) Take 1 tablet by mouth daily.    [provider]  estradiol (ESTRACE) 0.5 MG tablet estradiol 0.5 mg tablet  TAKE 1 TABLET BY MOUTH EVERY DAY 12/06/19   [provider]  fexofenadine-pseudoephedrine (ALLEGRA-D 24) 180-240 MG 24 hr tablet Take 1 tablet by mouth daily. 08/27/20   Grayce Sessions, NP  metroNIDAZOLE (FLAGYL) 500 MG tablet Take 1 tablet (500 mg total) by mouth 2 (two) times daily. 09/12/20   Grayce Sessions, NP  Multiple Vitamins-Minerals (ALIVE WOMENS 50+) CHEW Chew by mouth daily. 2 gummies daily    [provider]  psyllium (METAMUCIL SMOOTH TEXTURE) 58.6 % powder Take 1 packet by mouth 3 (three) times daily. 06/02/19   Romie Levee, MD  triamcinolone (NASACORT AQ) 55 MCG/ACT AERO nasal inhaler Place 2 sprays into the  nose daily. Patient taking differently: Place 2 sprays into the nose daily. 11/02/16   Isa Rankin, MD  levocetirizine (XYZAL) 5 MG tablet Take 5 mg by mouth every evening.  08/11/20  [provider]     Positive ROS: Otherwise negative  All other systems have been reviewed and were otherwise negative with the exception of those mentioned in the HPI and as above.  Physical Exam: Constitutional: Alert, well-appearing, no acute distress Ears: External ears without lesions or tenderness. Ear canals are clear bilaterally with intact, clear TMs.  Nasal: External nose without lesions. Septum midline with patent middle meatus regions bilaterally.  She does have a minimal amount of thick  mucus discharge in both middle meatus regions..  Nasal endoscopy revealed minimal thick mucus discharge.  Could not appreciate widely patent maxillary ostia.  Some of the mucus does have a little color to it. Oral: Lips and gums without lesions. Tongue and palate mucosa without lesions. Posterior oropharynx clear. Neck: No palpable adenopathy or masses Respiratory: Breathing comfortably  Skin: No facial/neck lesions or rash noted.  Procedures  Assessment: History of previous sinus surgery a year ago with continued problems with sinus problems and drainage.  On clinical exam today she still has up what appears to be some thick mucus drainage within the middle meatus bilaterally.  Plan: Placed her on Augmentin 875 mg twice daily for 10 days.  She will continue with saline irrigations. We will plan on obtaining a CT scan of the sinuses in 2 to 3 weeks and have her follow-up here in 3 weeks to review this.   Narda Bonds, MD   CC:

## 2020-10-02 ENCOUNTER — Other Ambulatory Visit (INDEPENDENT_AMBULATORY_CARE_PROVIDER_SITE_OTHER): Payer: Self-pay

## 2020-10-02 DIAGNOSIS — J329 Chronic sinusitis, unspecified: Secondary | ICD-10-CM

## 2020-10-17 ENCOUNTER — Ambulatory Visit
Admission: RE | Admit: 2020-10-17 | Discharge: 2020-10-17 | Disposition: A | Discharge status: Home / Self Care | Payer: 59 | Source: Ambulatory Visit | Attending: Otolaryngology | Admitting: Otolaryngology

## 2020-10-17 ENCOUNTER — Other Ambulatory Visit: Payer: Self-pay

## 2020-10-17 DIAGNOSIS — J329 Chronic sinusitis, unspecified: Secondary | ICD-10-CM

## 2020-10-18 ENCOUNTER — Ambulatory Visit (INDEPENDENT_AMBULATORY_CARE_PROVIDER_SITE_OTHER): Payer: 59 | Admitting: Otolaryngology

## 2020-10-18 VITALS — Temp 97.5°F

## 2020-10-18 DIAGNOSIS — J320 Chronic maxillary sinusitis: Secondary | ICD-10-CM | POA: Diagnosis not present

## 2020-10-18 NOTE — Progress Notes (Signed)
HPI: Susan Coleman is a 60 y.o. female who returns today for evaluation of sinuses.  She had previous surgery with Dr. Constance Holster a number of years ago.  She has had chronic problems mostly on the left side and the left cheek area.  Saw her and treated her with Flonase spray which she has been using as well as a course of Augmentin and got a repeat CT scan and on review of the repeat CT scan she appears to have an isolated obstruction and chronic left maxillary sinusitis.  The remaining sinuses were clear..  Past Medical History:  Diagnosis Date  . Diverticulosis of colon   . Internal hemorrhoid, bleeding   . Seasonal allergic rhinitis   . Wears partial dentures    upper   Past Surgical History:  Procedure Laterality Date  . COLONOSCOPY  last one 03-21-2019  dr Silverio Decamp  . LAPAROSCOPIC ABDOMINAL EXPLORATION  1986  . NASAL SINUS SURGERY  2019  . TRANSANAL HEMORRHOIDAL DEARTERIALIZATION N/A 06/02/2019   Procedure: TRANSANAL HEMORRHOIDAL DEARTERIALIZATION;  Surgeon: Leighton Ruff, MD;  Location: [facility];  Service: General;  Laterality: N/A;   Social History   Socioeconomic History  . Marital status: Married    Spouse name: Not on file  . Number of children: 2  . Years of education: Not on file  . Highest education level: Not on file  Occupational History  . Occupation: Housekeeping  Tobacco Use  . Smoking status: Current Some Day Smoker    Years: 10.00    Types: Cigarettes  . Smokeless tobacco: Never Used  . Tobacco comment: 3 cigs per week  Vaping Use  . Vaping Use: Never used  Substance and Sexual Activity  . Alcohol use: Yes    Comment: occasional  . Drug use: No  . Sexual activity: Yes    Partners: Male  Other Topics Concern  . Not on file  Social History Narrative  . Not on file   Social Determinants of Health   Financial Resource Strain: Not on file  Food Insecurity: Not on file  Transportation Needs: Not on file  Physical Activity: Not on file   Stress: Not on file  Social Connections: Not on file   Family History  Problem Relation Age of Onset  . Stroke Mother 88  . Cancer Mother        unsure what type  . High blood pressure Mother   . Breast cancer Half-Sister   . Colon cancer Neg Hx   . Esophageal cancer Neg Hx   . Stomach cancer Neg Hx   . Rectal cancer Neg Hx    No Known Allergies Prior to Admission medications   Medication Sig Start Date End Date Taking? Authorizing Provider  Cyanocobalamin (VITAMIN B 12 PO) Take 1 tablet by mouth daily.    [provider]  estradiol (ESTRACE) 0.5 MG tablet estradiol 0.5 mg tablet  TAKE 1 TABLET BY MOUTH EVERY DAY 12/06/19   [provider]  fexofenadine-pseudoephedrine (ALLEGRA-D 24) 180-240 MG 24 hr tablet Take 1 tablet by mouth daily. 08/27/20   Kerin Perna, NP  metroNIDAZOLE (FLAGYL) 500 MG tablet Take 1 tablet (500 mg total) by mouth 2 (two) times daily. 09/12/20   Kerin Perna, NP  Multiple Vitamins-Minerals (ALIVE WOMENS 50+) CHEW Chew by mouth daily. 2 gummies daily    [provider]  psyllium (METAMUCIL SMOOTH TEXTURE) 58.6 % powder Take 1 packet by mouth 3 (three) times daily. 06/02/19  Leighton Ruff, MD  triamcinolone (NASACORT AQ) 55 MCG/ACT AERO nasal inhaler Place 2 sprays into the nose daily. Patient taking differently: Place 2 sprays into the nose daily. 11/02/16   Wynona Luna, MD  levocetirizine (XYZAL) 5 MG tablet Take 5 mg by mouth every evening.  08/11/20  [provider]     Positive ROS: Otherwise negative  All other systems have been reviewed and were otherwise negative with the exception of those mentioned in the HPI and as above.  Physical Exam: Constitutional: Alert, well-appearing, no acute distress Ears: External ears without lesions or tenderness. Ear canals are clear bilaterally with intact, clear TMs.  Nasal: External nose without lesions. Septum midline with clear nasal passages although  on exam of the left middle meatus with the endoscope she has some slight thick mucus discharge but cannot identify definite patent ostia.. Oral: Lips and gums without lesions. Tongue and palate mucosa without lesions. Posterior oropharynx clear. Neck: No palpable adenopathy or masses Respiratory: Breathing comfortably  Skin: No facial/neck lesions or rash noted.  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  Plan: Reviewed with her concerning options.  Could take her back to the operating room where she could have both maxillary ostia better better enlarged although the right maxillary ostia has a small patency but is not that large.  Could easily enlarge the left maxillary ostia without too much difficulty.  The other option would be to try to perform this in the office which would offer less expense but possibly less success and discussed this with her.  She would like to have this performed in the office if possible. She will call us back in 3 weeks to schedule an office procedure for this.   Radene Journey, MD

## 2020-11-19 ENCOUNTER — Telehealth (INDEPENDENT_AMBULATORY_CARE_PROVIDER_SITE_OTHER): Payer: Self-pay

## 2020-11-19 NOTE — Telephone Encounter (Signed)
Called pt back after talking w/Dr. Lucia Gaskins. She is coming in on 12/03/2020 at 10:00 am for in office procedure.

## 2020-11-30 ENCOUNTER — Telehealth (INDEPENDENT_AMBULATORY_CARE_PROVIDER_SITE_OTHER): Payer: Self-pay

## 2020-11-30 ENCOUNTER — Other Ambulatory Visit (INDEPENDENT_AMBULATORY_CARE_PROVIDER_SITE_OTHER): Payer: Self-pay

## 2020-11-30 MED ORDER — AMOXICILLIN-POT CLAVULANATE 875-125 MG PO TABS: 1.0000 | ORAL_TABLET | Freq: Two times a day (BID) | ORAL | 0 refills | Status: DC

## 2020-11-30 NOTE — Telephone Encounter (Signed)
Pt called in and stated that she has sinus infection w/thick yellow mucas drainage. Per Dr. Lucia Gaskins I sent in Rx for Augmentin 875 mg BID x 10 days, to [facility].

## 2020-12-03 ENCOUNTER — Ambulatory Visit (INDEPENDENT_AMBULATORY_CARE_PROVIDER_SITE_OTHER): Payer: 59 | Admitting: Otolaryngology

## 2020-12-03 ENCOUNTER — Other Ambulatory Visit: Payer: Self-pay

## 2020-12-03 VITALS — Temp 97.0°F

## 2020-12-03 DIAGNOSIS — J320 Chronic maxillary sinusitis: Secondary | ICD-10-CM

## 2020-12-03 NOTE — Progress Notes (Signed)
HPI: Susan Coleman is a 60 y.o. female who returns today for evaluation of chronic sinus disease.  She has had previous recurrent sinus infections and has had previous sinus surgery.  She underwent a CT scan of her sinuses performed 2 months ago following several rounds of antibiotics and on review of the CT scan showed clear paranasal sinuses except for chronic mucoperiosteal thickening within the left maxillary sinus and obstruction of the left maxillary ostia.  She presents to the office today to have left maxillary antrostomy performed under local anesthetic.Marland Kitchen  Past Medical History:  Diagnosis Date  . Diverticulosis of colon   . Internal hemorrhoid, bleeding   . Seasonal allergic rhinitis   . Wears partial dentures    upper   Past Surgical History:  Procedure Laterality Date  . COLONOSCOPY  last one 03-21-2019  dr Silverio Decamp  . LAPAROSCOPIC ABDOMINAL EXPLORATION  1986  . NASAL SINUS SURGERY  2019  . TRANSANAL HEMORRHOIDAL DEARTERIALIZATION N/A 06/02/2019   Procedure: TRANSANAL HEMORRHOIDAL DEARTERIALIZATION;  Surgeon: Leighton Ruff, MD;  Location: [facility];  Service: General;  Laterality: N/A;   Social History   Socioeconomic History  . Marital status: Married    Spouse name: Not on file  . Number of children: 2  . Years of education: Not on file  . Highest education level: Not on file  Occupational History  . Occupation: Housekeeping  Tobacco Use  . Smoking status: Current Some Day Smoker    Years: 10.00    Types: Cigarettes  . Smokeless tobacco: Never Used  . Tobacco comment: 3 cigs per week  Vaping Use  . Vaping Use: Never used  Substance and Sexual Activity  . Alcohol use: Yes    Comment: occasional  . Drug use: No  . Sexual activity: Yes    Partners: Male  Other Topics Concern  . Not on file  Social History Narrative  . Not on file   Social Determinants of Health   Financial Resource Strain: Not on file  Food Insecurity: Not on file   Transportation Needs: Not on file  Physical Activity: Not on file  Stress: Not on file  Social Connections: Not on file   Family History  Problem Relation Age of Onset  . Stroke Mother 70  . Cancer Mother        unsure what type  . High blood pressure Mother   . Breast cancer Half-Sister   . Colon cancer Neg Hx   . Esophageal cancer Neg Hx   . Stomach cancer Neg Hx   . Rectal cancer Neg Hx    No Known Allergies Prior to Admission medications   Medication Sig Start Date End Date Taking? Authorizing Provider  amoxicillin-clavulanate (AUGMENTIN) 875-125 MG tablet Take 1 tablet by mouth 2 (two) times daily. 11/30/20   Rozetta Nunnery, MD  Cyanocobalamin (VITAMIN B 12 PO) Take 1 tablet by mouth daily.    [provider]  estradiol (ESTRACE) 0.5 MG tablet estradiol 0.5 mg tablet  TAKE 1 TABLET BY MOUTH EVERY DAY 12/06/19   [provider]  fexofenadine-pseudoephedrine (ALLEGRA-D 24) 180-240 MG 24 hr tablet Take 1 tablet by mouth daily. 08/27/20   Kerin Perna, NP  metroNIDAZOLE (FLAGYL) 500 MG tablet Take 1 tablet (500 mg total) by mouth 2 (two) times daily. 09/12/20   Kerin Perna, NP  Multiple Vitamins-Minerals (ALIVE WOMENS 50+) CHEW Chew by mouth daily. 2 gummies daily    [provider]  psyllium (  METAMUCIL SMOOTH TEXTURE) 58.6 % powder Take 1 packet by mouth 3 (three) times daily. 6/33/35   Leighton Ruff, MD  triamcinolone (NASACORT AQ) 55 MCG/ACT AERO nasal inhaler Place 2 sprays into the nose daily. Patient taking differently: Place 2 sprays into the nose daily. 11/02/16   Wynona Luna, MD  levocetirizine (XYZAL) 5 MG tablet Take 5 mg by mouth every evening.  08/11/20  [provider]     Positive ROS: Otherwise negative  All other systems have been reviewed and were otherwise negative with the exception of those mentioned in the HPI and as above.  Physical Exam: Constitutional: Alert, well-appearing, no acute  distress Ears: External ears without lesions or tenderness. Ear canals are clear bilaterally with intact, clear TMs.  Nasal: External nose without lesions. Septum midline..  Left middle meatus is widely patent. Oral: Lips and gums without lesions. Tongue and palate mucosa without lesions. Posterior oropharynx clear. Neck: No palpable adenopathy or masses Respiratory: Breathing comfortably  Skin: No facial/neck lesions or rash noted.  Procedure: The nose was first prepped with decongestant spray and topical Xylocaine.  Next a cotton pledget was placed within the left middle meatus with topical 2% Xylocaine and epinephrine.  The area was then injected with 2 to 3 cc of Xylocaine with epinephrine.  Using the 30 degree endoscope the middle meatus was carefully examined and cannot identify definite ostia into the maxillary sinus.  Using a curved sinus irrigation needle the maxillary ostia was entered.  The small opening was enlarged with straight through cutting forceps and backbiting cup forceps..  There was minimal bleeding.  Patient tolerated this well with minimal discomfort.  Following opening the ostia using a 30 degree scope the sinus could be visualized there was some small amount of mostly clear mucus discharge within the sinus.  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 Patient is on Augmentin that she started last week. Instructed to continue with saline rinses and nasal steroid spray and will follow up in 2 weeks for recheck.   Radene Journey, MD

## 2020-12-17 ENCOUNTER — Other Ambulatory Visit: Payer: Self-pay

## 2020-12-17 ENCOUNTER — Ambulatory Visit (INDEPENDENT_AMBULATORY_CARE_PROVIDER_SITE_OTHER): Payer: 59 | Admitting: Otolaryngology

## 2020-12-17 DIAGNOSIS — Z4889 Encounter for other specified surgical aftercare: Secondary | ICD-10-CM

## 2020-12-17 NOTE — Progress Notes (Signed)
HPI: Susan Coleman is a 60 y.o. female who presents 2 weeks days s/p left maxillary ostia enlargement performed in the office.  She has done well with no specific complaints and no mucopurulent discharge..   Past Medical History:  Diagnosis Date  . Diverticulosis of colon   . Internal hemorrhoid, bleeding   . Seasonal allergic rhinitis   . Wears partial dentures    upper   Past Surgical History:  Procedure Laterality Date  . COLONOSCOPY  last one 03-21-2019  dr Silverio Decamp  . LAPAROSCOPIC ABDOMINAL EXPLORATION  1986  . NASAL SINUS SURGERY  2019  . TRANSANAL HEMORRHOIDAL DEARTERIALIZATION N/A 06/02/2019   Procedure: TRANSANAL HEMORRHOIDAL DEARTERIALIZATION;  Surgeon: Leighton Ruff, MD;  Location: [facility];  Service: General;  Laterality: N/A;   Social History   Socioeconomic History  . Marital status: Married    Spouse name: Not on file  . Number of children: 2  . Years of education: Not on file  . Highest education level: Not on file  Occupational History  . Occupation: Housekeeping  Tobacco Use  . Smoking status: Current Some Day Smoker    Years: 10.00    Types: Cigarettes  . Smokeless tobacco: Never Used  . Tobacco comment: 3 cigs per week  Vaping Use  . Vaping Use: Never used  Substance and Sexual Activity  . Alcohol use: Yes    Comment: occasional  . Drug use: No  . Sexual activity: Yes    Partners: Male  Other Topics Concern  . Not on file  Social History Narrative  . Not on file   Social Determinants of Health   Financial Resource Strain: Not on file  Food Insecurity: Not on file  Transportation Needs: Not on file  Physical Activity: Not on file  Stress: Not on file  Social Connections: Not on file   Family History  Problem Relation Age of Onset  . Stroke Mother 11  . Cancer Mother        unsure what type  . High blood pressure Mother   . Breast cancer Half-Sister   . Colon cancer Neg Hx   . Esophageal cancer Neg Hx   . Stomach  cancer Neg Hx   . Rectal cancer Neg Hx    No Known Allergies Prior to Admission medications   Medication Sig Start Date End Date Taking? Authorizing Provider  amoxicillin-clavulanate (AUGMENTIN) 875-125 MG tablet Take 1 tablet by mouth 2 (two) times daily. 11/30/20   Rozetta Nunnery, MD  Cyanocobalamin (VITAMIN B 12 PO) Take 1 tablet by mouth daily.    [provider]  estradiol (ESTRACE) 0.5 MG tablet estradiol 0.5 mg tablet  TAKE 1 TABLET BY MOUTH EVERY DAY 12/06/19   [provider]  fexofenadine-pseudoephedrine (ALLEGRA-D 24) 180-240 MG 24 hr tablet Take 1 tablet by mouth daily. 08/27/20   Kerin Perna, NP  metroNIDAZOLE (FLAGYL) 500 MG tablet Take 1 tablet (500 mg total) by mouth 2 (two) times daily. 09/12/20   Kerin Perna, NP  Multiple Vitamins-Minerals (ALIVE WOMENS 50+) CHEW Chew by mouth daily. 2 gummies daily    [provider]  psyllium (METAMUCIL SMOOTH TEXTURE) 58.6 % powder Take 1 packet by mouth 3 (three) times daily. 1/74/08   Leighton Ruff, MD  triamcinolone (NASACORT AQ) 55 MCG/ACT AERO nasal inhaler Place 2 sprays into the nose daily. Patient taking differently: Place 2 sprays into the nose daily. 11/02/16   Wynona Luna, MD  levocetirizine Harlow Ohms)  5 MG tablet Take 5 mg by mouth every evening.  08/11/20  [provider]     Physical Exam: Left nasal passageways clear.  Left middle meatus region is widely patent with no obvious purulent discharge.   Assessment: S/p left maxillary ostia enlargement  Plan: She will follow-up as needed any further problems.   Radene Journey, MD

## 2021-02-07 ENCOUNTER — Other Ambulatory Visit (HOSPITAL_COMMUNITY)
Admission: RE | Admit: 2021-02-07 | Discharge: 2021-02-07 | Disposition: A | Discharge status: Home / Self Care | Payer: 59 | Source: Ambulatory Visit | Attending: Primary Care | Admitting: Primary Care

## 2021-02-07 ENCOUNTER — Other Ambulatory Visit: Payer: Self-pay

## 2021-02-07 ENCOUNTER — Ambulatory Visit (INDEPENDENT_AMBULATORY_CARE_PROVIDER_SITE_OTHER): Payer: 59 | Admitting: Primary Care

## 2021-02-07 ENCOUNTER — Encounter (INDEPENDENT_AMBULATORY_CARE_PROVIDER_SITE_OTHER): Payer: Self-pay | Admitting: Primary Care

## 2021-02-07 VITALS — BP 133/81 | HR 95 | Temp 97.5°F | Resp 16 | Wt 121.0 lb

## 2021-02-07 DIAGNOSIS — Z780 Asymptomatic menopausal state: Secondary | ICD-10-CM | POA: Diagnosis not present

## 2021-02-07 DIAGNOSIS — R300 Dysuria: Secondary | ICD-10-CM

## 2021-02-07 DIAGNOSIS — Z113 Encounter for screening for infections with a predominantly sexual mode of transmission: Secondary | ICD-10-CM | POA: Diagnosis not present

## 2021-02-07 LAB — POCT URINALYSIS DIP (CLINITEK)
Bilirubin, UA: NEGATIVE
Glucose, UA: NEGATIVE mg/dL
Ketones, POC UA: NEGATIVE mg/dL
Leukocytes, UA: NEGATIVE
Nitrite, UA: NEGATIVE
POC PROTEIN,UA: NEGATIVE
Spec Grav, UA: 1.025 (ref 1.010–1.025)
Urobilinogen, UA: 0.2 E.U./dL
pH, UA: 6 (ref 5.0–8.0)

## 2021-02-07 NOTE — Progress Notes (Signed)
Scratchy throat x 2 weeks  Vomiting  X 2 days   White vaginal discharge x 2 weeks   Concerns with estrogen, burns skin

## 2021-02-07 NOTE — Progress Notes (Signed)
[facility]   Subjective:   Susan Coleman is a 60 y.o. [phone] female complains of an abnormal vaginal discharge for 2 weeks. Discharge described as: copious, white and thick. Vaginal symptoms include none.Vulvar symptoms include pain.STI Risk: Possible STD exposure.   Other associated symptoms: burning, discharge described as white and curd-like, local irritation and pain.Menstrual pattern: She had been bleeding none. Contraception: none.  She denies vomiting, abdominal pain and diarrhea recent antibiotic exposure, denies vomiting, abdominal pain, diarrhea and difficulty breathing changes in soaps, detergents coinciding with the onset of her symptoms.  She has not previously self treated or been under treatment by another provider for these symptoms.     Gynecologic History Patient's last menstrual period was 09/29/2011. Contraception: none Last Pap: 09/04/20. Last mammogram: schedule appt in July Obstetric History OB History  Gravida Para Term Preterm AB Living  2 2 2     2   SAB IAB Ectopic Multiple Live Births               # Outcome Date GA Lbr Len/2nd Weight Sex Delivery Anes PTL Lv  2 Term           1 Term             Past Medical History:  Diagnosis Date  . Diverticulosis of colon   . Internal hemorrhoid, bleeding   . Seasonal allergic rhinitis   . Wears partial dentures    upper    Past Surgical History:  Procedure Laterality Date  . COLONOSCOPY  last one 03-21-2019  dr Silverio Decamp  . LAPAROSCOPIC ABDOMINAL EXPLORATION  1986  . NASAL SINUS SURGERY  2019  . TRANSANAL HEMORRHOIDAL DEARTERIALIZATION N/A 06/02/2019   Procedure: TRANSANAL HEMORRHOIDAL DEARTERIALIZATION;  Surgeon: Leighton Ruff, MD;  Location: [facility];  Service: General;  Laterality: N/A;    Current Outpatient Medications on File Prior to Visit  Medication Sig Dispense Refill  . amoxicillin-clavulanate (AUGMENTIN) 875-125 MG tablet Take 1 tablet by mouth 2 (two) times  daily. 20 tablet 0  . Cyanocobalamin (VITAMIN B 12 PO) Take 1 tablet by mouth daily.    Marland Kitchen estradiol (ESTRACE) 0.5 MG tablet estradiol 0.5 mg tablet  TAKE 1 TABLET BY MOUTH EVERY DAY    . fexofenadine-pseudoephedrine (ALLEGRA-D 24) 180-240 MG 24 hr tablet Take 1 tablet by mouth daily. 30 tablet 2  . metroNIDAZOLE (FLAGYL) 500 MG tablet Take 1 tablet (500 mg total) by mouth 2 (two) times daily. 14 tablet 0  . Multiple Vitamins-Minerals (ALIVE WOMENS 50+) CHEW Chew by mouth daily. 2 gummies daily    . psyllium (METAMUCIL SMOOTH TEXTURE) 58.6 % powder Take 1 packet by mouth 3 (three) times daily. 283 g 12  . triamcinolone (NASACORT AQ) 55 MCG/ACT AERO nasal inhaler Place 2 sprays into the nose daily. (Patient taking differently: Place 2 sprays into the nose daily.) 1 Inhaler 0  . [DISCONTINUED] levocetirizine (XYZAL) 5 MG tablet Take 5 mg by mouth every evening.     No current facility-administered medications on file prior to visit.    No Known Allergies  Social History   Socioeconomic History  . Marital status: Married    Spouse name: Not on file  . Number of children: 2  . Years of education: Not on file  . Highest education level: Not on file  Occupational History  . Occupation: Housekeeping  Tobacco Use  . Smoking status: Current Some Day Smoker    Years: 10.00  Types: Cigarettes  . Smokeless tobacco: Never Used  . Tobacco comment: 3 cigs per week  Vaping Use  . Vaping Use: Never used  Substance and Sexual Activity  . Alcohol use: Yes    Comment: occasional  . Drug use: No  . Sexual activity: Yes    Partners: Male  Other Topics Concern  . Not on file  Social History Narrative  . Not on file   Social Determinants of Health   Financial Resource Strain: Not on file  Food Insecurity: Not on file  Transportation Needs: Not on file  Physical Activity: Not on file  Stress: Not on file  Social Connections: Not on file  Intimate Partner Violence: Not on file     Family History  Problem Relation Age of Onset  . Stroke Mother 12  . Cancer Mother        unsure what type  . High blood pressure Mother   . Breast cancer Half-Sister   . Colon cancer Neg Hx   . Esophageal cancer Neg Hx   . Stomach cancer Neg Hx   . Rectal cancer Neg Hx     The following portions of the patient's history were reviewed and updated as appropriate: allergies, current medications, past family history, past medical history, past social history, past surgical history and problem list.  Review of Systems Pertinent items noted in HPI and remainder of comprehensive ROS otherwise negative.   Objective:  LMP 09/29/2011  CONSTITUTIONAL: Well-developed, well-nourished female in no acute distress.  HENT:  Normocephalic, atraumatic, External right and left ear normal. Oropharynx is clear and moist EYES: Conjunctivae and EOM are normal. Pupils are equal, round, and reactive to light. No scleral icterus.  NECK: Normal range of motion, supple, no masses.  Normal thyroid.  SKIN: Skin is warm and dry. No rash noted. Not diaphoretic. No erythema. No pallor. [place]: Alert and oriented to person, place, and time. Normal reflexes, muscle tone coordination. No cranial nerve deficit noted. PSYCHIATRIC: Normal mood and affect. Normal behavior. Normal judgment and thought content. CARDIOVASCULAR: Normal heart rate noted, regular rhythm RESPIRATORY: Clear to auscultation bilaterally. Effort and breath sounds normal, no problems with respiration noted. BREASTS: Symmetric in size. No masses, skin changes, nipple drainage, or lymphadenopathy. Taught self breast exam and had patient to demonstrate SBE. ABDOMEN: Soft, normal bowel sounds, no distention noted.  No tenderness, rebound or guarding.  PELVIC: Normal appearing external genitalia; normal appearing vaginal mucosa and cervix.  No abnormal discharge noted.  Pap smear obtained.  Normal uterine size, no other palpable masses, no uterine  or adnexal tenderness. MUSCULOSKELETAL: Normal range of motion. No tenderness.  No cyanosis, clubbing, or edema.  2+ distal pulses.   Assessment:  Diagnoses and all orders for this visit:  Dysuria -     POCT URINALYSIS DIP (CLINITEK)- trace of blood negative nitrates or leukocytes.  Increase water intake to 64 ounces daily and drink cranberry juice 100%  Screening examination for STD (sexually transmitted disease) -     Cervicovaginal ancillary only  Postmenopausal -     DG Bone Density; Future     If tests results are positive, please abstain from sexual activity until you and your partner(s) have been treated Return precautions given to come here or go to ER if you have any new or worsening symptoms fever, chills, nausea, vomiting, abdominal or pelvic pain, painful intercourse, vaginal discharge, vaginal bleeding, persistent symptoms despite treatment, etc...  Reviewed expectations re: course of current medical issues. Questions  answered. Outlined signs and symptoms indicating need for more acute intervention. Patient verbalized understanding. After Visit Summary given.   Plan:   Will follow up on all labs ordered today.  Patient counseled regarding condom use with each sexual activity to promote wellness and prevention of transmission of HIV, syphilis, herpes simplex virus, gonorrhea, chlamydia and trichomoniasis.   This note has been created with Surveyor, quantity. Any transcriptional errors are unintentional.   Kerin Perna, NP 02/07/2021, 11:32 AM

## 2021-02-11 ENCOUNTER — Telehealth: Payer: Self-pay | Admitting: Primary Care

## 2021-02-11 ENCOUNTER — Other Ambulatory Visit: Payer: Self-pay | Admitting: Family Medicine

## 2021-02-11 LAB — CERVICOVAGINAL ANCILLARY ONLY
Bacterial Vaginitis (gardnerella): POSITIVE — AB
Candida Glabrata: NEGATIVE
Candida Vaginitis: NEGATIVE
Chlamydia: NEGATIVE
Comment: NEGATIVE
Comment: NEGATIVE
Comment: NEGATIVE
Comment: NEGATIVE
Comment: NEGATIVE
Comment: NORMAL
Neisseria Gonorrhea: NEGATIVE
Trichomonas: NEGATIVE

## 2021-02-11 MED ORDER — METRONIDAZOLE 0.75 % VA GEL: 1.0000 | Freq: Every day | VAGINAL | 0 refills | Status: DC

## 2021-02-11 NOTE — Telephone Encounter (Signed)
Copied from [place] [id number]. Topic: General - Other >> Feb 08, 2021  5:22 PM Pawlus, Brayton Layman A wrote: Reason for CRM: Pt wanted to go over her recent lab results, pt stated if she doesn't answer to leave a VM.

## 2021-02-13 ENCOUNTER — Telehealth: Payer: Self-pay

## 2021-02-13 NOTE — Telephone Encounter (Signed)
Patient was called and a voicemail was left informing patient to return phone call for lab results. 

## 2021-02-13 NOTE — Telephone Encounter (Signed)
-----   Message from Charlott Rakes, MD sent at 02/11/2021  1:17 PM EDT ----- Please inform this patient of Michelle's that vaginal culture was positive for bacterial vaginosis and I have sent a prescription for MetroGel to her pharmacy. Thanks

## 2021-02-13 NOTE — Telephone Encounter (Signed)
Left message asking patient to return call to office

## 2021-02-15 ENCOUNTER — Telehealth: Payer: Self-pay

## 2021-02-15 NOTE — Telephone Encounter (Signed)
Patient was called and a voicemail was left informing patient to return phone call for lab results. 

## 2021-02-15 NOTE — Telephone Encounter (Signed)
Left message asking patient to return call to [phone].

## 2021-02-15 NOTE — Telephone Encounter (Signed)
-----   Message from Charlott Rakes, MD sent at 02/11/2021  1:17 PM EDT ----- Please inform this patient of Michelle's that vaginal culture was positive for bacterial vaginosis and I have sent a prescription for MetroGel to her pharmacy. Thanks

## 2021-02-19 NOTE — Telephone Encounter (Signed)
Patient returned call and is aware of positive BV results. She has already picked up Rx and completed.

## 2021-03-04 ENCOUNTER — Ambulatory Visit (INDEPENDENT_AMBULATORY_CARE_PROVIDER_SITE_OTHER): Payer: Self-pay | Admitting: *Deleted

## 2021-03-04 NOTE — Telephone Encounter (Signed)
Reason for Disposition  [1] Sinus congestion (pressure, fullness) AND [2] present > 10 days  Answer Assessment - Initial Assessment Questions 1. LOCATION: "Where does it hurt?"      head 2. ONSET: "When did the sinus pain start?"  (e.g., hours, days)      days 3. SEVERITY: "How bad is the pain?"   (Scale 1-10; mild, moderate or severe)   - MILD (1-3): doesn't interfere with normal activities    - MODERATE (4-7): interferes with normal activities (e.g., work or school) or awakens from sleep   - SEVERE (8-10): excruciating pain and patient unable to do any normal activities        moderate 4. RECURRENT SYMPTOM: "Have you ever had sinus problems before?" If Yes, ask: "When was the last time?" and "What happened that time?"     yes 5. NASAL CONGESTION: "Is the nose blocked?" If Yes, ask: "Can you open it or must you breathe through your mouth?"     Off and on 6. NASAL DISCHARGE: "Do you have discharge from your nose?" If so ask, "What color?"     green 7. FEVER: "Do you have a fever?" If Yes, ask: "What is it, how was it measured, and when did it start?"      no 8. OTHER SYMPTOMS: "Do you have any other symptoms?" (e.g., sore throat, cough, earache, difficulty breathing)     No taste or smell 9. PREGNANCY: "Is there any chance you are pregnant?" "When was your last menstrual period?"     no  Protocols used: Sinus Pain or Congestion-A-AH  Patient called stating she has sinus infection and is having green mucous and OTC meds not helping. Could not get an appt today when offices were open. Instructed that if mucous is green that indicates infection. Pt advised to call MD office back in the morning and tell them that her mucous is green and ask if can have antibiotic until MD office can see her. She agreed.

## 2021-03-04 NOTE — Telephone Encounter (Signed)
Patient called to report a possible sinus infection with thick mucus in her chest and head. No available appt until 03/20/21 at [facility]. Requesting advise.  Called patient to review symptoms. No answer. LVMTCB.

## 2021-03-05 NOTE — Telephone Encounter (Signed)
Pt returned call, reports nasal discharge greenish, states afebrile, denies SOB. Advised PCP would not call in antibiotics without appt. Directed to Mobile Unit. States she will follow disposition. Assured pt NT would route to practice for PCPs review. Advised to CB if needed.  Pt verbalizes understanding.

## 2021-03-08 ENCOUNTER — Other Ambulatory Visit: Payer: 59

## 2021-03-12 ENCOUNTER — Other Ambulatory Visit: Payer: 59

## 2021-04-01 ENCOUNTER — Ambulatory Visit: Admission: RE | Admit: 2021-04-01 | Payer: Self-pay | Source: Ambulatory Visit

## 2021-04-01 ENCOUNTER — Other Ambulatory Visit: Payer: Self-pay

## 2021-05-24 ENCOUNTER — Encounter (INDEPENDENT_AMBULATORY_CARE_PROVIDER_SITE_OTHER): Payer: Self-pay | Admitting: Primary Care

## 2021-05-24 ENCOUNTER — Ambulatory Visit (INDEPENDENT_AMBULATORY_CARE_PROVIDER_SITE_OTHER): Payer: 59 | Admitting: Primary Care

## 2021-05-24 ENCOUNTER — Other Ambulatory Visit: Payer: Self-pay

## 2021-05-24 VITALS — BP 122/82 | HR 80 | Temp 97.9°F | Ht 62.0 in | Wt 116.6 lb

## 2021-05-24 DIAGNOSIS — J0100 Acute maxillary sinusitis, unspecified: Secondary | ICD-10-CM | POA: Diagnosis not present

## 2021-05-24 MED ORDER — FLUCONAZOLE 150 MG PO TABS: 150.0000 mg | ORAL_TABLET | Freq: Once | ORAL | 0 refills | Status: AC

## 2021-05-24 MED ORDER — DM-GUAIFENESIN ER 30-600 MG PO TB12: 1.0000 | ORAL_TABLET | Freq: Two times a day (BID) | ORAL | 1 refills | Status: DC

## 2021-05-24 MED ORDER — DOXYCYCLINE HYCLATE 100 MG PO TABS: 100.0000 mg | ORAL_TABLET | Freq: Two times a day (BID) | ORAL | 0 refills | Status: DC

## 2021-05-24 NOTE — Patient Instructions (Signed)

## 2021-05-24 NOTE — Progress Notes (Signed)
.  [facility]in Subjective:     Susan Coleman is a 60 y.o. female who presents for evaluation of sinus pain. Symptoms include: congestion, cough, and itchy eyes. Onset of symptoms was 2 weeks ago. Symptoms have been unchanged since that time. Past history is significant for no history of pneumonia or bronchitis. Patient is a smoker The following portions of the patient's history were reviewed and updated as appropriate: allergies, current medications, past family history, past medical history, past social history, past surgical history, and problem list.  Review of Systems Pertinent items noted in HPI and remainder of comprehensive ROS otherwise negative.   Objective:    BP 122/82 (BP Location: Right Arm, Patient Position: Sitting, Cuff Size: Normal)   Pulse 80   Temp 97.9 F (36.6 C) (Temporal)   Ht '5\' 2"'$  (1.575 m)   Wt 116 lb 9.6 oz (52.9 kg)   LMP 09/29/2011   SpO2 100%   BMI 21.33 kg/m  General appearance: alert, cooperative, appears stated age, and cachectic Head: Normocephalic, without obvious abnormality, frontal and maxillary tenderness Eyes: watery, conjunctivae/corneas clear. PERRL, EOM's intact. Fundi benign. Ears: normal TM's and external ear canals both ears Nose: Nares normal. Septum midline. Mucosa normal. No drainage or sinus tenderness., clear discharge Neck: no adenopathy, no carotid bruit, no JVD, supple, symmetrical, trachea midline, and thyroid not enlarged, symmetric, no tenderness/mass/nodules Lungs: wheezes bilaterally, LLL, and RLL Heart: regular rate and rhythm, S1, S2 normal, no murmur, click, rub or gallop Abdomen: soft, non-tender; bowel sounds normal; no masses,  no organomegaly Extremities: extremities normal, atraumatic, no cyanosis or edema Skin: Skin color, texture, turgor normal. No rashes or lesions Lymph nodes: Cervical, supraclavicular, and axillary nodes normal.    Assessment:    Acute bacterial sinusitis.    Plan:    Doxycyline  per medication  orders, mucinex DM Referred to ENT.  This note has been created with Surveyor, quantity. Any transcriptional errors are unintentional.  Kerin Perna NP

## 2021-05-24 NOTE — Progress Notes (Signed)
At home covid test negative last week

## 2021-08-05 ENCOUNTER — Ambulatory Visit (INDEPENDENT_AMBULATORY_CARE_PROVIDER_SITE_OTHER): Payer: Self-pay

## 2021-08-05 NOTE — Telephone Encounter (Signed)
Returned pt's call - LMOM    Summary: Clinical Advice   Patient experiencing no fever, body aches, constant head aches, sore throat and fatigue for a few days.

## 2021-08-05 NOTE — Telephone Encounter (Signed)
Pt called, left message to return call to office to discuss with nurse. Unable to reach pt after 3 attempts. Routing to provider for resolution per protocol.

## 2021-08-05 NOTE — Telephone Encounter (Signed)
2nd attempt to reach pt. Left VM to call back to discuss symptoms.  

## 2021-08-06 ENCOUNTER — Encounter (HOSPITAL_COMMUNITY): Payer: Self-pay | Admitting: Emergency Medicine

## 2021-08-06 ENCOUNTER — Other Ambulatory Visit: Payer: Self-pay

## 2021-08-06 ENCOUNTER — Ambulatory Visit (HOSPITAL_COMMUNITY)
Admission: EM | Admit: 2021-08-06 | Discharge: 2021-08-06 | Disposition: A | Discharge status: Home / Self Care | Payer: 59

## 2021-08-06 DIAGNOSIS — J208 Acute bronchitis due to other specified organisms: Secondary | ICD-10-CM

## 2021-08-06 DIAGNOSIS — J09X2 Influenza due to identified novel influenza A virus with other respiratory manifestations: Secondary | ICD-10-CM | POA: Diagnosis not present

## 2021-08-06 LAB — POCT URINALYSIS DIPSTICK, ED / UC
Bilirubin Urine: NEGATIVE
Glucose, UA: NEGATIVE mg/dL
Hgb urine dipstick: NEGATIVE
Ketones, ur: 15 mg/dL — AB
Leukocytes,Ua: NEGATIVE
Nitrite: NEGATIVE
Protein, ur: NEGATIVE mg/dL
Specific Gravity, Urine: 1.025 (ref 1.005–1.030)
Urobilinogen, UA: 0.2 mg/dL (ref 0.0–1.0)
pH: 6 (ref 5.0–8.0)

## 2021-08-06 LAB — POC INFLUENZA A AND B ANTIGEN (URGENT CARE ONLY)
INFLUENZA A ANTIGEN, POC: POSITIVE — AB
INFLUENZA B ANTIGEN, POC: NEGATIVE

## 2021-08-06 MED ORDER — OSELTAMIVIR PHOSPHATE 75 MG PO CAPS: 75.0000 mg | ORAL_CAPSULE | Freq: Two times a day (BID) | ORAL | 0 refills | Status: DC

## 2021-08-06 MED ORDER — PROMETHAZINE-DM 6.25-15 MG/5ML PO SYRP: 5.0000 mL | ORAL_SOLUTION | Freq: Four times a day (QID) | ORAL | 0 refills | Status: DC | PRN

## 2021-08-06 MED ORDER — PREDNISONE 20 MG PO TABS: 40.0000 mg | ORAL_TABLET | Freq: Every day | ORAL | 0 refills | Status: DC

## 2021-08-06 NOTE — ED Provider Notes (Signed)
[facility]    CSN: [id number] Arrival date & time: 08/06/21  1547      History   Chief Complaint No chief complaint on file.   HPI Susan Coleman is a 60 y.o. female.   HPI  Patient presents with several days of mild cough which is progressed to congestion, generalized body aches and poor appetite.  Patient endorses recent sick contacts.  Her cough is nonproductive.  In general she just feels unwell.  Denies any known fever.  Past Medical History:  Diagnosis Date   Diverticulosis of colon    Internal hemorrhoid, bleeding    Seasonal allergic rhinitis    Wears partial dentures    upper    Patient Active Problem List   Diagnosis Date Noted   Nasal congestion 01/21/2016   Bacterial vaginitis 01/21/2016   Allergic rhinitis 12/20/2015   Healthcare maintenance 12/20/2015   Acute sinusitis 12/20/2015    Past Surgical History:  Procedure Laterality Date   COLONOSCOPY  last one 03-21-2019  dr nandigam   LAPAROSCOPIC ABDOMINAL EXPLORATION  1986   NASAL SINUS SURGERY  2019   TRANSANAL HEMORRHOIDAL DEARTERIALIZATION N/A 06/02/2019   Procedure: TRANSANAL HEMORRHOIDAL DEARTERIALIZATION;  Surgeon: Leighton Ruff, MD;  Location: [facility];  Service: General;  Laterality: N/A;    OB History     Gravida  2   Para  2   Term  2   Preterm      AB      Living  2      SAB      IAB      Ectopic      Multiple      Live Births               Home Medications    Prior to Admission medications   Medication Sig Start Date End Date Taking? Authorizing Provider  dextromethorphan-guaiFENesin (MUCINEX DM) 30-600 MG 12hr tablet Take 1 tablet by mouth 2 (two) times daily. 05/24/21  Yes Kerin Perna, NP  fexofenadine-pseudoephedrine (ALLEGRA-D 24) 180-240 MG 24 hr tablet Take 1 tablet by mouth daily. 08/27/20  Yes Kerin Perna, NP  oseltamivir (TAMIFLU) 75 MG capsule Take 1 capsule (75 mg total) by mouth 2 (two) times daily.  08/06/21  Yes Scot Jun, FNP  predniSONE (DELTASONE) 20 MG tablet Take 2 tablets (40 mg total) by mouth daily with breakfast. 08/06/21  Yes Scot Jun, FNP  promethazine-dextromethorphan (PROMETHAZINE-DM) 6.25-15 MG/5ML syrup Take 5 mLs by mouth 4 (four) times daily as needed for cough. 08/06/21  Yes Scot Jun, FNP  Cyanocobalamin (VITAMIN B 12 PO) Take 1 tablet by mouth daily. Patient not taking: Reported on 08/06/2021    [provider]  doxycycline (VIBRA-TABS) 100 MG tablet Take 1 tablet (100 mg total) by mouth 2 (two) times daily. Patient not taking: Reported on 08/06/2021 05/24/21   Kerin Perna, NP  estradiol (ESTRACE) 0.5 MG tablet estradiol 0.5 mg tablet  TAKE 1 TABLET BY MOUTH EVERY DAY Patient not taking: Reported on 08/06/2021 12/06/19   [provider]  metroNIDAZOLE (METROGEL VAGINAL) 0.75 % vaginal gel Place 1 Applicatorful vaginally at bedtime. Patient not taking: Reported on 08/06/2021 02/11/21   Charlott Rakes, MD  Multiple Vitamins-Minerals (ALIVE WOMENS 50+) CHEW Chew by mouth daily. 2 gummies daily    [provider]  psyllium (METAMUCIL SMOOTH TEXTURE) 58.6 % powder Take 1 packet by mouth 3 (three) times daily. 06/02/19   Marcello Moores,  Elmo Putt, MD  triamcinolone (NASACORT AQ) 55 MCG/ACT AERO nasal inhaler Place 2 sprays into the nose daily. Patient taking differently: Place 2 sprays into the nose daily. 11/02/16   Wynona Luna, MD  levocetirizine (XYZAL) 5 MG tablet Take 5 mg by mouth every evening.  08/11/20  [provider]    Family History Family History  Problem Relation Age of Onset   Stroke Mother 19   Cancer Mother        unsure what type   High blood pressure Mother    Breast cancer Half-Sister    Colon cancer Neg Hx    Esophageal cancer Neg Hx    Stomach cancer Neg Hx    Rectal cancer Neg Hx     Social History Social History   Tobacco Use   Smoking status: Some Days    Years: 10.00     Types: Cigarettes   Smokeless tobacco: Never   Tobacco comments:    3 cigs per week  Vaping Use   Vaping Use: Never used  Substance Use Topics   Alcohol use: Yes    Comment: occasional   Drug use: No     Allergies   Patient has no known allergies.   Review of Systems Review of Systems Pertinent negatives listed in HPI   Physical Exam Triage Vital Signs ED Triage Vitals  Enc Vitals Group     BP 08/06/21 1649 125/72     Pulse Rate 08/06/21 1649 96     Resp 08/06/21 1649 18     Temp 08/06/21 1649 98.1 F (36.7 C)     Temp Source 08/06/21 1649 Oral     SpO2 08/06/21 1649 100 %     Weight --      Height --      Head Circumference --      Peak Flow --      Pain Score 08/06/21 1645 8     Pain Loc --      Pain Edu? --      Excl. in [place]? --    No data found.  Updated Vital Signs BP 125/72 (BP Location: Left Arm)   Pulse 96   Temp 98.1 F (36.7 C) (Oral)   Resp 18   LMP 09/29/2011   SpO2 100%   Visual Acuity Right Eye Distance:   Left Eye Distance:   Bilateral Distance:    Right Eye Near:   Left Eye Near:    Bilateral Near:     Physical Exam General appearance: alert, Ill-appearing, no distress Head: Normocephalic, without obvious abnormality, atraumatic ENT: TMs normal, mucosal edema, congestion, oropharynx without exudate or erythema  Respiratory: Respirations even , unlabored, coarse lung sound, expiratory wheeze Heart: rate and rhythm normal. No gallop or murmurs noted on exam  Abdomen: BS +, no distention, no rebound tenderness, or no mass Extremities: No gross deformities Skin: Skin color, texture, turgor normal. No rashes seen  Psych: Appropriate mood and affect. Neurologic: No focal neurological abnormalities.  UC Treatments / Results  Labs (all labs ordered are listed, but only abnormal results are displayed) Labs Reviewed  POC INFLUENZA A AND B ANTIGEN (URGENT CARE ONLY) - Abnormal; Notable for the following components:      Result Value    INFLUENZA A ANTIGEN, POC POSITIVE (*)    All other components within normal limits  POCT URINALYSIS DIPSTICK, ED / UC - Abnormal; Notable for the following components:   Ketones, ur 15 (*)  All other components within normal limits    EKG   Radiology No results found.  Procedures Procedures (including critical care time)  Medications Ordered in UC Medications - No data to display  Initial Impression / Assessment and Plan / UC Course  I have reviewed the triage vital signs and the nursing notes.  Pertinent labs & imaging results that were available during my care of the patient were reviewed by me and considered in my medical decision making (see chart for details).  Influenza A positive Treatment per discharge instruction Force fluids and increase fluid intake as tolerated Red flag and ER precautions given Return to urgent care as needed f Final Clinical Impressions(s) / UC Diagnoses   Final diagnoses:  Influenza due to identified novel influenza A virus with other respiratory manifestations  Viral bronchitis     Discharge Instructions      Your test was positive for influenza A.  Start Tamiflu 75 mg twice daily for 5 days to reduce the the severity and course of flu symptoms. To reduce the inflammation in your chest that is causing you to cough start prednisone 40 mg once daily for 5 days. Promethazine DM you may take up to 4 times daily as needed for cough.  If develop any severe shortness of breath, chest pain or tightness or your symptoms worsen in severity go immediately to the emergency department.     ED Prescriptions     Medication Sig Dispense Auth. Provider   oseltamivir (TAMIFLU) 75 MG capsule Take 1 capsule (75 mg total) by mouth 2 (two) times daily. 10 capsule Scot Jun, FNP   promethazine-dextromethorphan (PROMETHAZINE-DM) 6.25-15 MG/5ML syrup Take 5 mLs by mouth 4 (four) times daily as needed for cough. 140 mL Scot Jun, FNP    predniSONE (DELTASONE) 20 MG tablet Take 2 tablets (40 mg total) by mouth daily with breakfast. 10 tablet Scot Jun, FNP      PDMP not reviewed this encounter.   Scot Jun, FNP 08/06/21 1756

## 2021-08-06 NOTE — Telephone Encounter (Signed)
Reason for Disposition . [1] MODERATE weakness (i.e., interferes with work, school, normal activities) AND [2] cause unknown  (Exceptions: weakness with acute minor illness, or weakness from poor fluid intake)  Answer Assessment - Initial Assessment Questions 1. DESCRIPTION: "Describe how you are feeling."     Weak no energy 2. SEVERITY: "How bad is it?"  "Can you stand and walk?"   - MILD - Feels weak or tired, but does not interfere with work, school or normal activities   - [place] to stand and walk; weakness interferes with work, school, or normal activities   - SEVERE - Unable to stand or walk     mild 3. ONSET:  "When did the weakness begin?"     1-2 weeks 4. CAUSE: "What do you think is causing the weakness?"     Congestion- ? flu 5. MEDICINES: "Have you recently started a new medicine or had a change in the amount of a medicine?"     no 6. OTHER SYMPTOMS: "Do you have any other symptoms?" (e.g., chest pain, fever, cough, SOB, vomiting, diarrhea, bleeding, other areas of pain)     Diarrhea- over the weekend- OTC medication helped- off on, no taste 7. PREGNANCY: "Is there any chance you are pregnant?" "When was your last menstrual period?"     na  Protocols used: Weakness (Generalized) and Fatigue-A-AH

## 2021-08-06 NOTE — Discharge Instructions (Signed)
Your test was positive for influenza A.  Start Tamiflu 75 mg twice daily for 5 days to reduce the the severity and course of flu symptoms. To reduce the inflammation in your chest that is causing you to cough start prednisone 40 mg once daily for 5 days. Promethazine DM you may take up to 4 times daily as needed for cough.  If develop any severe shortness of breath, chest pain or tightness or your symptoms worsen in severity go immediately to the emergency department.

## 2021-08-06 NOTE — Telephone Encounter (Signed)
Patient is calling to report she has had ongoing fatigue and congestion for over 1 week. Patient states she has also having diarrhea that she has been treating with OTC medication and hydration. Patient states she had COVID test 2 weeks ago- but has not tested again since her symptoms started. Advised UC per office note and disposition. Patient states she will go and been seen- hopefully they can evaluate and she can get to feeling better.

## 2021-08-06 NOTE — Telephone Encounter (Signed)
Left message asking patient to return call to [facility] at [phone]. Should she call back, please advise her to go to urgent care to be evaluated as PCP office does not have any availability until end of November.

## 2021-08-06 NOTE — ED Triage Notes (Signed)
Headache and body aches, diarrhea and weakness for one week.

## 2021-08-16 ENCOUNTER — Emergency Department (HOSPITAL_BASED_OUTPATIENT_CLINIC_OR_DEPARTMENT_OTHER)
Admission: EM | Admit: 2021-08-16 | Discharge: 2021-08-16 | Disposition: A | Discharge status: Home / Self Care | Payer: 59 | Attending: Emergency Medicine | Admitting: Emergency Medicine

## 2021-08-16 ENCOUNTER — Encounter (HOSPITAL_BASED_OUTPATIENT_CLINIC_OR_DEPARTMENT_OTHER): Payer: Self-pay | Admitting: *Deleted

## 2021-08-16 ENCOUNTER — Other Ambulatory Visit: Payer: Self-pay

## 2021-08-16 DIAGNOSIS — F17210 Nicotine dependence, cigarettes, uncomplicated: Secondary | ICD-10-CM | POA: Insufficient documentation

## 2021-08-16 DIAGNOSIS — S0035XA Superficial foreign body of nose, initial encounter: Secondary | ICD-10-CM

## 2021-08-16 DIAGNOSIS — X58XXXA Exposure to other specified factors, initial encounter: Secondary | ICD-10-CM | POA: Diagnosis not present

## 2021-08-16 DIAGNOSIS — T171XXA Foreign body in nostril, initial encounter: Secondary | ICD-10-CM | POA: Diagnosis present

## 2021-08-16 MED ORDER — IBUPROFEN 600 MG PO TABS: 600.0000 mg | ORAL_TABLET | Freq: Four times a day (QID) | ORAL | 0 refills | Status: DC | PRN

## 2021-08-16 MED ORDER — AMOXICILLIN-POT CLAVULANATE 875-125 MG PO TABS: 1.0000 | ORAL_TABLET | Freq: Two times a day (BID) | ORAL | 0 refills | Status: DC

## 2021-08-16 NOTE — ED Provider Notes (Signed)
[facility] EMERGENCY DEPT Provider Note   CSN: [id number] Arrival date & time: 08/16/21  0907     History Chief Complaint  Patient presents with   Foreign Body in [place]    Q-tip Susan Coleman is a 60 y.o. female.  HPI Patient reports that she was trying to use Q-tips last night to clear out her nose.  She reports one of them slipped into the left nostril and she could not get it back out.  She reports it happened about 3 AM.  She is been trying all morning to get it and just keeps getting pushed further back.  She reports that she is got pain away in the back of her nose and is radiating up to her ear and into her cheek.  Patient reports has had multiple sinus surgeries previously but always continues to have drainage    Past Medical History:  Diagnosis Date   Diverticulosis of colon    Internal hemorrhoid, bleeding    Seasonal allergic rhinitis    Wears partial dentures    upper    Patient Active Problem List   Diagnosis Date Noted   Nasal congestion 01/21/2016   Bacterial vaginitis 01/21/2016   Allergic rhinitis 12/20/2015   Healthcare maintenance 12/20/2015   Acute sinusitis 12/20/2015    Past Surgical History:  Procedure Laterality Date   COLONOSCOPY  last one 03-21-2019  dr nandigam   LAPAROSCOPIC ABDOMINAL EXPLORATION  1986   NASAL SINUS SURGERY  2019   TRANSANAL HEMORRHOIDAL DEARTERIALIZATION N/A 06/02/2019   Procedure: TRANSANAL HEMORRHOIDAL DEARTERIALIZATION;  Surgeon: Leighton Ruff, MD;  Location: [facility];  Service: General;  Laterality: N/A;     OB History     Gravida  2   Para  2   Term  2   Preterm      AB      Living  2      SAB      IAB      Ectopic      Multiple      Live Births              Family History  Problem Relation Age of Onset   Stroke Mother 36   Cancer Mother        unsure what type   High blood pressure Mother    Breast cancer Half-Sister    Colon cancer Neg  Hx    Esophageal cancer Neg Hx    Stomach cancer Neg Hx    Rectal cancer Neg Hx     Social History   Tobacco Use   Smoking status: Some Days    Years: 10.00    Types: Cigarettes   Smokeless tobacco: Never   Tobacco comments:    3 cigs per week  Vaping Use   Vaping Use: Never used  Substance Use Topics   Alcohol use: Yes    Comment: occasional   Drug use: No    Home Medications Prior to Admission medications   Medication Sig Start Date End Date Taking? Authorizing Provider  amoxicillin-clavulanate (AUGMENTIN) 875-125 MG tablet Take 1 tablet by mouth 2 (two) times daily. One po bid x 7 days 08/16/21  Yes Evaleen Sant, Jeannie Done, MD  ibuprofen (ADVIL) 600 MG tablet Take 1 tablet (600 mg total) by mouth every 6 (six) hours as needed. 08/16/21  Yes Charlesetta Shanks, MD  Cyanocobalamin (VITAMIN B 12 PO) Take 1 tablet by mouth daily. Patient not  taking: Reported on 08/06/2021    [provider]  dextromethorphan-guaiFENesin (MUCINEX DM) 30-600 MG 12hr tablet Take 1 tablet by mouth 2 (two) times daily. 05/24/21   Kerin Perna, NP  doxycycline (VIBRA-TABS) 100 MG tablet Take 1 tablet (100 mg total) by mouth 2 (two) times daily. Patient not taking: Reported on 08/06/2021 05/24/21   Kerin Perna, NP  estradiol (ESTRACE) 0.5 MG tablet estradiol 0.5 mg tablet  TAKE 1 TABLET BY MOUTH EVERY DAY Patient not taking: Reported on 08/06/2021 12/06/19   [provider]  fexofenadine-pseudoephedrine (ALLEGRA-D 24) 180-240 MG 24 hr tablet Take 1 tablet by mouth daily. 08/27/20   Kerin Perna, NP  metroNIDAZOLE (METROGEL VAGINAL) 0.75 % vaginal gel Place 1 Applicatorful vaginally at bedtime. Patient not taking: Reported on 08/06/2021 02/11/21   Charlott Rakes, MD  Multiple Vitamins-Minerals (ALIVE WOMENS 50+) CHEW Chew by mouth daily. 2 gummies daily    [provider]  oseltamivir (TAMIFLU) 75 MG capsule Take 1 capsule (75 mg total) by mouth 2 (two) times daily.  08/06/21   Scot Jun, FNP  predniSONE (DELTASONE) 20 MG tablet Take 2 tablets (40 mg total) by mouth daily with breakfast. 08/06/21   Scot Jun, FNP  promethazine-dextromethorphan (PROMETHAZINE-DM) 6.25-15 MG/5ML syrup Take 5 mLs by mouth 4 (four) times daily as needed for cough. 08/06/21   Scot Jun, FNP  psyllium (METAMUCIL SMOOTH TEXTURE) 58.6 % powder Take 1 packet by mouth 3 (three) times daily. 7/51/70   Leighton Ruff, MD  triamcinolone (NASACORT AQ) 55 MCG/ACT AERO nasal inhaler Place 2 sprays into the nose daily. Patient taking differently: Place 2 sprays into the nose daily. 11/02/16   Wynona Luna, MD  levocetirizine (XYZAL) 5 MG tablet Take 5 mg by mouth every evening.  08/11/20  [provider]    Allergies    Patient has no known allergies.  Review of Systems   Review of Systems Constitutional: No fever no chills no malaise Respiratory: Cough, no shortness of breath Physical Exam Updated Vital Signs BP (!) 138/94 (BP Location: Right Arm)   Pulse 99   Temp 98.8 F (37.1 C)   Resp 20   Wt 52.6 kg   LMP 09/29/2011   SpO2 99%   BMI 21.22 kg/m   Physical Exam Constitutional:      Comments: Patient is alert nontoxic.  No respiratory distress.  She is uncomfortable.  HENT:     Head: Normocephalic and atraumatic.     Comments: No facial swelling    Left Ear: Tympanic membrane normal.     Ears:     Comments: TM normal without any hemotympanum or bulging    Nose:     Comments: Patient has large, easily visualized nasal passages.  No visible foreign body, blood or secretion within the nasal passages.    Mouth/Throat:     Comments: Throat shows a white foreign body hanging from the nasopharynx to the oropharynx consistent with the top of a Q-tip.  Throat is widely patent.  There is no blood or other soft tissue swelling. Pulmonary:     Effort: Pulmonary effort is normal.  Musculoskeletal:        General: Normal range of motion.   Skin:    General: Skin is warm and dry.  Neurological:     General: No focal deficit present.     Mental Status: She is oriented to person, place, and time.  Psychiatric:  Mood and Affect: Mood normal.    ED Results / Procedures / Treatments   Labs (all labs ordered are listed, but only abnormal results are displayed) Labs Reviewed - No data to display  EKG None  Radiology No results found.  Procedures .Foreign Body Removal  Date/Time: 08/16/2021 1:08 PM Performed by: Charlesetta Shanks, MD Authorized by: Charlesetta Shanks, MD  Consent: Verbal consent obtained. Consent given by: patient Patient identity confirmed: verbally with patient Body area: throat  Sedation: Patient sedated: no  Patient restrained: no Patient cooperative: yes Removal mechanism: forceps Complexity: complex 1 objects recovered. Objects recovered: 1 complete Q-tip with 1 tip missing. Post-procedure assessment: foreign body removed Patient tolerance: patient tolerated the procedure well with no immediate complications Comments: I have rechecked after removal, no bleeding.  Q-tip head was grasped with a McGill forcep and extracted through the oropharynx.  The entirety of the handle was intact with the distal tip of the Q-tip missing.  The handle was a fairly stiff plastic.  Patient tolerated well.  There was no bleeding after removal.  No difficulty breathing or choking    Medications Ordered in ED Medications - No data to display  ED Course  I have reviewed the triage vital signs and the nursing notes.  Pertinent labs & imaging results that were available during my care of the patient were reviewed by me and considered in my medical decision making (see chart for details).    MDM Rules/Calculators/A&P                           Patient presents with complaint of foreign body in the left nare.  She had pain deep in her throat radiating into her zygoma and ear.  Only the tip of a Q-tip was  visualized through the oropharynx.  There was no visible blood or foreign body from inspection of the nares.  This was removed without bleeding or airway compromise.  This did have a approximately 5 cm stiff plastic shaft.  Due to pain in the face and potential trauma from the retained foreign body will prescribe Augmentin at this time.  Patient is counseled to follow-up with her ENT ASAP for recheck.  Physical exam at this time does not show objective traumatic injury. Final Clinical Impression(s) / ED Diagnoses Final diagnoses:  Acute foreign body of nose, initial encounter    Rx / DC Orders ED Discharge Orders          Ordered    amoxicillin-clavulanate (AUGMENTIN) 875-125 MG tablet  2 times daily        08/16/21 1303    ibuprofen (ADVIL) 600 MG tablet  Every 6 hours PRN        08/16/21 1303             Charlesetta Shanks, MD 08/16/21 1312

## 2021-08-16 NOTE — ED Triage Notes (Signed)
C/o L maxillary sinus and L ear pain, relates to Qtip stuck in L nare at 3am. Clear drainage.

## 2021-08-16 NOTE — Discharge Instructions (Signed)
1.  You had a large Q-tip removed from your nose.  You may have some trauma in the back of your nose and throat from this foreign body.  At this time you are being prescribed Augmentin.  Take twice daily as prescribed.  You may take ibuprofen as prescribed for pain control.  Do not flush or do any other type of activities to clear your nasal passage.  No blowing your nose.  You may only dab to clear airway secretions if needed. 2.  Schedule appointment to see your ear nose throat doctor for recheck as soon as possible.  You may need special evaluation to see if there has been any damage to the area is not visible in the back of the throat and nose. 3.  Return to emergency department if you get fever, facial swelling, significantly worsening pain or other concerning symptoms

## 2021-10-04 ENCOUNTER — Encounter (INDEPENDENT_AMBULATORY_CARE_PROVIDER_SITE_OTHER): Payer: Self-pay | Admitting: Primary Care

## 2021-10-04 ENCOUNTER — Ambulatory Visit (INDEPENDENT_AMBULATORY_CARE_PROVIDER_SITE_OTHER): Payer: PRIVATE HEALTH INSURANCE | Admitting: Primary Care

## 2021-10-04 ENCOUNTER — Other Ambulatory Visit: Payer: Self-pay

## 2021-10-04 VITALS — BP 121/85 | HR 62 | Temp 98.0°F | Ht 62.0 in | Wt 116.8 lb

## 2021-10-04 DIAGNOSIS — M5440 Lumbago with sciatica, unspecified side: Secondary | ICD-10-CM | POA: Diagnosis not present

## 2021-10-04 DIAGNOSIS — Z23 Encounter for immunization: Secondary | ICD-10-CM

## 2021-10-04 DIAGNOSIS — R5382 Chronic fatigue, unspecified: Secondary | ICD-10-CM

## 2021-10-04 DIAGNOSIS — J320 Chronic maxillary sinusitis: Secondary | ICD-10-CM

## 2021-10-04 DIAGNOSIS — Z0000 Encounter for general adult medical examination without abnormal findings: Secondary | ICD-10-CM

## 2021-10-04 DIAGNOSIS — Z1231 Encounter for screening mammogram for malignant neoplasm of breast: Secondary | ICD-10-CM

## 2021-10-04 DIAGNOSIS — Z0001 Encounter for general adult medical examination with abnormal findings: Secondary | ICD-10-CM | POA: Diagnosis not present

## 2021-10-04 DIAGNOSIS — Z1211 Encounter for screening for malignant neoplasm of colon: Secondary | ICD-10-CM

## 2021-10-04 MED ORDER — AMOXICILLIN-POT CLAVULANATE 875-125 MG PO TABS: 1.0000 | ORAL_TABLET | Freq: Two times a day (BID) | ORAL | 0 refills | Status: DC

## 2021-10-04 MED ORDER — FLUCONAZOLE 50 MG PO TABS: 50.0000 mg | ORAL_TABLET | Freq: Every day | ORAL | 2 refills | Status: DC

## 2021-10-04 MED ORDER — FLUTICASONE PROPIONATE 50 MCG/ACT NA SUSP: 2.0000 | Freq: Every day | NASAL | 1 refills | Status: DC

## 2021-10-04 NOTE — Patient Instructions (Signed)
Pneumococcal Conjugate Vaccine (Prevnar 13) Suspension for Injection What is this medication? PNEUMOCOCCAL VACCINE (NEU mo KOK al vak SEEN) is a vaccine used to prevent pneumococcus bacterial infections. These bacteria can cause serious infections like pneumonia, meningitis, and blood infections. This vaccine will lower your chance of getting pneumonia. If you do get pneumonia, it can make your symptoms milder and your illness shorter. This vaccine will not treat an infection and will not cause infection. This vaccine is recommended for infants and young children, adults with certain medical conditions, and adults 70 years or older. This medicine may be used for other purposes; ask your health care provider or pharmacist if you have questions. COMMON BRAND NAME(S): Prevnar, Prevnar 13 What should I tell my care team before I take this medication? They need to know if you have any of these conditions: bleeding problems fever immune system problems an unusual or allergic reaction to pneumococcal vaccine, diphtheria toxoid, other vaccines, latex, other medicines, foods, dyes, or preservatives pregnant or trying to get pregnant breast-feeding How should I use this medication? This vaccine is for injection into a muscle. It is given by a health care professional. A copy of Vaccine Information Statements will be given before each vaccination. Read this sheet carefully each time. The sheet may change frequently. Talk to your pediatrician regarding the use of this medicine in children. While this drug may be prescribed for children as young as 23 weeks old for selected conditions, precautions do apply. Overdosage: If you think you have taken too much of this medicine contact a poison control center or emergency room at once. NOTE: This medicine is only for you. Do not share this medicine with others. What if I miss a dose? It is important not to miss your dose. Call your doctor or health care professional  if you are unable to keep an appointment. What may interact with this medication? medicines for cancer chemotherapy medicines that suppress your immune function steroid medicines like prednisone or cortisone This list may not describe all possible interactions. Give your health care provider a list of all the medicines, herbs, non-prescription drugs, or dietary supplements you use. Also tell them if you smoke, drink alcohol, or use illegal drugs. Some items may interact with your medicine. What should I watch for while using this medication? Mild fever and pain should go away in 3 days or less. Report any unusual symptoms to your doctor or health care professional. What side effects may I notice from receiving this medication? Side effects that you should report to your doctor or health care professional as soon as possible: allergic reactions like skin rash, itching or hives, swelling of the face, lips, or tongue breathing problems confused fast or irregular heartbeat fever over 102 degrees F seizures unusual bleeding or bruising unusual muscle weakness Side effects that usually do not require medical attention (report to your doctor or health care professional if they continue or are bothersome): aches and pains diarrhea fever of 102 degrees F or less headache irritable loss of appetite pain, tender at site where injected trouble sleeping This list may not describe all possible side effects. Call your doctor for medical advice about side effects. You may report side effects to FDA at 1-800-FDA-1088. Where should I keep my medication? This does not apply. This vaccine is given in a clinic, pharmacy, doctor's office, or other health care setting and will not be stored at home. NOTE: This sheet is a summary. It may not cover all possible  information. If you have questions about this medicine, talk to your doctor, pharmacist, or health care provider.  2022 Elsevier/Gold Standard  (2014-06-15 00:00:00) Influenza, Adult Influenza is also called "the flu." It is an infection in the lungs, nose, and throat (respiratory tract). It spreads easily from person to person (is contagious). The flu causes symptoms that are like a cold, along with high fever and body aches. What are the causes? This condition is caused by the influenza virus. You can get the virus by: Breathing in droplets that are in the air after a person infected with the flu coughed or sneezed. Touching something that has the virus on it and then touching your mouth, nose, or eyes. What increases the risk? Certain things may make you more likely to get the flu. These include: Not washing your hands often. Having close contact with many people during cold and flu season. Touching your mouth, eyes, or nose without first washing your hands. Not getting a flu shot every year. You may have a higher risk for the flu, and serious problems, such as a lung infection (pneumonia), if you: Are older than 65. Are pregnant. Have a weakened disease-fighting system (immune system) because of a disease or because you are taking certain medicines. Have a long-term (chronic) condition, such as: Heart, kidney, or lung disease. Diabetes. Asthma. Have a liver disorder. Are very overweight (morbidly obese). Have anemia. What are the signs or symptoms? Symptoms usually begin suddenly and last 4-14 days. They may include: Fever and chills. Headaches, body aches, or muscle aches. Sore throat. Cough. Runny or stuffy (congested) nose. Feeling discomfort in your chest. Not wanting to eat as much as normal. Feeling weak or tired. Feeling dizzy. Feeling sick to your stomach or throwing up. How is this treated? If the flu is found early, you can be treated with antiviral medicine. This can help to reduce how bad the illness is and how long it lasts. This may be given by mouth or through an IV tube. Taking care of yourself at  home can help your symptoms get better. Your doctor may want you to: Take over-the-counter medicines. Drink plenty of fluids. The flu often goes away on its own. If you have very bad symptoms or other problems, you may be treated in a hospital. Follow these instructions at home:   Activity Rest as needed. Get plenty of sleep. Stay home from work or school as told by your doctor. Do not leave home until you do not have a fever for 24 hours without taking medicine. Leave home only to go to your doctor. Eating and drinking Take an ORS (oral rehydration solution). This is a drink that is sold at pharmacies and stores. Drink enough fluid to keep your pee pale yellow. Drink clear fluids in small amounts as you are able. Clear fluids include: Water. Ice chips. Fruit juice mixed with water. Low-calorie sports drinks. Eat bland foods that are easy to digest. Eat small amounts as you are able. These foods include: Bananas. Applesauce. Rice. Lean meats. Toast. Crackers. Do not eat or drink: Fluids that have a lot of sugar or caffeine. Alcohol. Spicy or fatty foods. General instructions Take over-the-counter and prescription medicines only as told by your doctor. Use a cool mist humidifier to add moisture to the air in your home. This can make it easier for you to breathe. When using a cool mist humidifier, clean it daily. Empty water and replace with clean water. Cover your mouth and nose  when you cough or sneeze. Wash your hands with soap and water often and for at least 20 seconds. This is also important after you cough or sneeze. If you cannot use soap and water, use alcohol-based hand sanitizer. Keep all follow-up visits. How is this prevented?  Get a flu shot every year. You may get the flu shot in late summer, fall, or winter. Ask your doctor when you should get your flu shot. Avoid contact with people who are sick during fall and winter. This is cold and flu season. Contact a  doctor if: You get new symptoms. You have: Chest pain. Watery poop (diarrhea). A fever. Your cough gets worse. You start to have more mucus. You feel sick to your stomach. You throw up. Get help right away if you: Have shortness of breath. Have trouble breathing. Have skin or nails that turn a bluish color. Have very bad pain or stiffness in your neck. Get a sudden headache. Get sudden pain in your face or ear. Cannot eat or drink without throwing up. These symptoms may represent a serious problem that is an emergency. Get medical help right away. Call your local emergency services (911 in the [place] Do not wait to see if the symptoms will go away. Do not drive yourself to the hospital. Summary Influenza is also called "the flu." It is an infection in the lungs, nose, and throat. It spreads easily from person to person. Take over-the-counter and prescription medicines only as told by your doctor. Getting a flu shot every year is the best way to not get the flu. This information is not intended to replace advice given to you by your health care provider. Make sure you discuss any questions you have with your health care provider. Document Revised: 04/27/2020 Document Reviewed: 04/27/2020 Elsevier Patient Education  [employer].

## 2021-10-04 NOTE — Progress Notes (Signed)
[name] is a 61 y.o. female presents to office today for annual physical exam examination.    Concerns today include: 1. Right side back pain that radiates down leg 8/10 -   2. Sinus pressure   Occupation: homemaker , Marital status: M, Substance use: No  Diet: Regular diet , Exercise: yes  Last eye exam: unknown  Last dental exam: 09/12/21- [place] colonoscopy: ordered today  Last mammogram: 11/26/2018 Last pap smear: 09/06/20 Refills needed today: c/o sinus  Immunizations needed: Flu Vaccine: yes  Tdap Vaccine: yes  - every 74yrs - (<3 lifetime doses or unknown): all wounds -- look up need for Tetanus IG - (>=3 lifetime doses): clean/minor wound if >74yrs from previous; all other wounds if >78yrs from previous Zoster Vaccine: yes (those >50yo, once) Pneumonia Vaccine: yes (those w/ risk factors) - (<32yr) Both: Immunocompromised, cochlear implant, CSF leak, asplenic, sickle cell, Chronic Renal Failure - (<55yr) PPSV-23 only: Heart dz, lung disease, DM, tobacco abuse, alcoholism, cirrhosis/liver disease. - (>69yr): PPSV13 then PPSV23 in 6-12mths;  - (>73yr): repeat PPSV23 once if pt received prior to 61yo and 71yrs have passed  Past Medical History:  Diagnosis Date   Diverticulosis of colon    Internal hemorrhoid, bleeding    Seasonal allergic rhinitis    Wears partial dentures    upper   Social History   Socioeconomic History   Marital status: Married    Spouse name: Not on file   Number of children: 2   Years of education: Not on file   Highest education level: Not on file  Occupational History   Occupation: Housekeeping  Tobacco Use   Smoking status: Some Days    Years: 10.00    Types: Cigarettes   Smokeless tobacco: Never   Tobacco comments:    3 cigs per week  Vaping Use   Vaping Use: Never used  Substance and Sexual Activity   Alcohol use: Yes    Comment: occasional   Drug use: No   Sexual activity: Yes    Partners: Male   Other Topics Concern   Not on file  Social History Narrative   Not on file   Social Determinants of Health   Financial Resource Strain: Not on file  Food Insecurity: Not on file  Transportation Needs: Not on file  Physical Activity: Not on file  Stress: Not on file  Social Connections: Not on file  Intimate Partner Violence: Not on file   Past Surgical History:  Procedure Laterality Date   COLONOSCOPY  last one 03-21-2019  dr Susan Coleman   LAPAROSCOPIC ABDOMINAL EXPLORATION  1986   NASAL SINUS SURGERY  2019   TRANSANAL HEMORRHOIDAL DEARTERIALIZATION N/A 06/02/2019   Procedure: TRANSANAL HEMORRHOIDAL DEARTERIALIZATION;  Surgeon: Susan Ruff, MD;  Location: [facility];  Service: General;  Laterality: N/A;   Family History  Problem Relation Age of Onset   Stroke Mother 74   Cancer Mother        unsure what type   High blood pressure Mother    Breast cancer Half-Sister    Colon cancer Neg Hx    Esophageal cancer Neg Hx    Stomach cancer Neg Hx    Rectal cancer Neg Hx     Current Outpatient Medications:    dextromethorphan-guaiFENesin (MUCINEX DM) 30-600 MG 12hr tablet, Take 1 tablet by mouth 2 (two) times daily., Disp: 60 tablet, Rfl: 1   ibuprofen (ADVIL) 600 MG tablet, Take 1 tablet (600  mg total) by mouth every 6 (six) hours as needed., Disp: 30 tablet, Rfl: 0   Multiple Vitamins-Minerals (ALIVE WOMENS 50+) [place], Susan Coleman by mouth daily. 2 gummies daily, Disp: , Rfl:    psyllium (METAMUCIL SMOOTH TEXTURE) 58.6 % powder, Take 1 packet by mouth 3 (three) times daily., Disp: 283 g, Rfl: 12   fluticasone (FLONASE) 50 MCG/ACT nasal spray, Place into both nostrils., Disp: , Rfl:   No Known Allergies   ROS: Review of Systems Pertinent items noted in HPI and remainder of comprehensive ROS otherwise negative.    Physical exam BP 121/85 (BP Location: Right Arm, Patient Position: Sitting, Cuff Size: Small)    Pulse 62    Temp 98 F (36.7 C) (Oral)    Ht 5\' 2"   (1.575 m)    Wt 116 lb 12.8 oz (53 kg)    LMP 09/29/2011    SpO2 99%    BMI 21.36 kg/m  General appearance: alert, cooperative, appears stated age, and no distress Eyes: conjunctivae/corneas clear. PERRL, EOM's intact. Fundi benign. Nose: Nares normal. Septum midline. Mucosa normal. No drainage or sinus tenderness. Neck: no adenopathy, no carotid bruit, no JVD, supple, symmetrical, trachea midline, and thyroid not enlarged, symmetric, no tenderness/mass/nodules Back: symmetric, no curvature. ROM normal. No CVA tenderness. Heart: regular rate and rhythm, S1, S2 normal, no murmur, click, rub or gallop Abdomen: soft, non-tender; bowel sounds normal; no masses,  no organomegaly Extremities: extremities normal, atraumatic, no cyanosis or edema Skin: Skin color, texture, turgor normal. No rashes or lesions Lymph nodes: Cervical, supraclavicular, and axillary nodes normal. Neurologic: Alert and oriented X 3, normal strength and tone. Normal symmetric reflexes. Normal coordination and gait    Assessment/ Plan: Susan Coleman here for annual physical exam.  Susan was seen today for annual exam and sinusitis.  Diagnoses and all orders for this visit:  Annual physical exam  Need for prophylactic vaccination against Streptococcus pneumoniae (pneumococcus) -     Pneumococcal conjugate vaccine 20-valent (Prevnar 20)  Acute right-sided low back pain with sciatica, sciatica laterality unspecified -     Ambulatory referral to Orthopedic Surgery  Chronic maxillary sinusitis -     amoxicillin-clavulanate (AUGMENTIN) 875-125 MG tablet; Take 1 tablet by mouth 2 (two) times daily.  Colon cancer screening -     Ambulatory referral to Gastroenterology  Chronic fatigue -     Vitamin B12 -     CBC with Differential -     TSH + free T4 -     Vitamin D, 25-hydroxy  Breast cancer screening by mammogram -     Cancel: MM Digital Screening; Future -     MM 3D SCREEN BREAST BILATERAL; Future  Other  orders -     fluconazole (DIFLUCAN) 50 MG tablet; Take 1 tablet (50 mg total) by mouth daily. -     fluticasone (FLONASE) 50 MCG/ACT nasal spray; Place 2 sprays into both nostrils daily.    Counseled on healthy lifestyle choices, including diet (rich in fruits, vegetables and lean meats and low in salt and simple carbohydrates) and exercise (at least 30 minutes of moderate physical activity daily).  Patient to follow up in 1 year for annual exam or sooner if needed.  The above assessment and management plan was discussed with the patient. The patient verbalized understanding of and has agreed to the management plan. Patient is aware to call the clinic if symptoms persist or worsen. Patient is aware when to return to the clinic for a  follow-up visit. Patient educated on when it is appropriate to go to the emergency department.   Juluis Mire NP-C [address] [place] [phone]

## 2021-10-09 LAB — CBC WITH DIFFERENTIAL/PLATELET
Basophils Absolute: 0.1 10*3/uL (ref 0.0–0.2)
Basos: 1 %
EOS (ABSOLUTE): 0.3 10*3/uL (ref 0.0–0.4)
Eos: 4 %
Hematocrit: 37 % (ref 34.0–46.6)
Hemoglobin: 12.5 g/dL (ref 11.1–15.9)
Immature Grans (Abs): 0 10*3/uL (ref 0.0–0.1)
Immature Granulocytes: 0 %
Lymphocytes Absolute: 3.4 10*3/uL — ABNORMAL HIGH (ref 0.7–3.1)
Lymphs: 38 %
MCH: 33 pg (ref 26.6–33.0)
MCHC: 33.8 g/dL (ref 31.5–35.7)
MCV: 98 fL — ABNORMAL HIGH (ref 79–97)
Monocytes Absolute: 0.8 10*3/uL (ref 0.1–0.9)
Monocytes: 9 %
Neutrophils Absolute: 4.4 10*3/uL (ref 1.4–7.0)
Neutrophils: 48 %
Platelets: 284 10*3/uL (ref 150–450)
RBC: 3.79 x10E6/uL (ref 3.77–5.28)
RDW: 13.7 % (ref 11.7–15.4)
WBC: 8.9 10*3/uL (ref 3.4–10.8)

## 2021-10-09 LAB — TSH+FREE T4
Free T4: 1 ng/dL (ref 0.82–1.77)
TSH: 2.33 u[IU]/mL (ref 0.450–4.500)

## 2021-10-09 LAB — VITAMIN D 25 HYDROXY (VIT D DEFICIENCY, FRACTURES): Vit D, 25-Hydroxy: 54.9 ng/mL (ref 30.0–100.0)

## 2021-10-09 LAB — VITAMIN B12: Vitamin B-12: >2000 pg/mL — ABNORMAL HIGH (ref 232–1245)

## 2021-10-14 ENCOUNTER — Telehealth (INDEPENDENT_AMBULATORY_CARE_PROVIDER_SITE_OTHER): Payer: Self-pay

## 2021-10-14 NOTE — Telephone Encounter (Signed)
Contacted patient per message she left about lab results. Made patient aware that labs are normal and to stop taking vitamin B12 as levels are too high. She verbalized understanding. Nat Christen, CMA

## 2021-10-15 DIAGNOSIS — Z23 Encounter for immunization: Secondary | ICD-10-CM | POA: Diagnosis not present

## 2021-10-18 ENCOUNTER — Ambulatory Visit: Payer: PRIVATE HEALTH INSURANCE | Admitting: Physician Assistant

## 2021-10-24 ENCOUNTER — Ambulatory Visit
Admission: RE | Admit: 2021-10-24 | Discharge: 2021-10-24 | Disposition: A | Discharge status: Home / Self Care | Payer: PRIVATE HEALTH INSURANCE | Source: Ambulatory Visit | Attending: Primary Care | Admitting: Primary Care

## 2021-10-24 DIAGNOSIS — Z1231 Encounter for screening mammogram for malignant neoplasm of breast: Secondary | ICD-10-CM

## 2021-10-29 ENCOUNTER — Other Ambulatory Visit: Payer: Self-pay

## 2021-10-29 ENCOUNTER — Ambulatory Visit (INDEPENDENT_AMBULATORY_CARE_PROVIDER_SITE_OTHER): Payer: PRIVATE HEALTH INSURANCE

## 2021-10-29 ENCOUNTER — Ambulatory Visit (INDEPENDENT_AMBULATORY_CARE_PROVIDER_SITE_OTHER): Payer: PRIVATE HEALTH INSURANCE | Admitting: Orthopaedic Surgery

## 2021-10-29 ENCOUNTER — Encounter: Payer: Self-pay | Admitting: Orthopaedic Surgery

## 2021-10-29 VITALS — BP 106/72 | HR 102 | Ht 62.0 in | Wt 116.0 lb

## 2021-10-29 DIAGNOSIS — M545 Low back pain, unspecified: Secondary | ICD-10-CM

## 2021-10-29 DIAGNOSIS — G8929 Other chronic pain: Secondary | ICD-10-CM | POA: Diagnosis not present

## 2021-10-29 NOTE — Progress Notes (Signed)
Office Visit Note   Patient: Susan Coleman           Date of Birth: 1961/06/25           MRN: [id number] Visit Date: 10/29/2021              Requested by: Kerin Perna, NP [address] [place],  [place] [address] PCP: Kerin Perna, NP   Assessment & Plan: Visit Diagnoses:  1. Chronic bilateral low back pain, unspecified whether sciatica present     Plan: We reviewed the plain radiographs with patient she has L4-5 disc space narrowing and facet arthropathy.  We will set her up for some physical therapy and follow-up after imaging.  She may have some lateral recess or foraminal compression consistent with her symptoms.  Follow-Up Instructions: No follow-ups on file.   Orders:  Orders Placed This Encounter  Procedures   XR Lumbar Spine 2-3 Views   No orders of the defined types were placed in this encounter.     Procedures: No procedures performed   Clinical Data: No additional findings.   Subjective: Chief Complaint  Patient presents with   Lower Back - Pain    HPI 61 year old female new patient visit with chronic back pain pain started on the right side lower back now radiates into the right leg.  She has some pain in the left but not as severe as the right.  Pain radiates down the posterior thigh to her knee sometimes into her calf.  She is used ibuprofen and Tylenol with some relief some numbness and tingling in her feet she denies claudication symptoms.  Sometimes the pain wakes her up at night no fever or chills.  No previous surgeries in her lumbar spine.  Review of Systems all other systems noncontributory to HPI.   Objective: Vital Signs: BP 106/72    Pulse (!) 102    Ht 5\' 2"  (1.575 m)    Wt 116 lb (52.6 kg)    LMP 09/29/2011    BMI 21.22 kg/m   Physical Exam Constitutional:      Appearance: She is well-developed.  HENT:     Head: Normocephalic.     Right Ear: External ear normal.     Left Ear: External ear normal. There is no  impacted cerumen.  Eyes:     Pupils: Pupils are equal, round, and reactive to light.  Neck:     Thyroid: No thyromegaly.     Trachea: No tracheal deviation.  Cardiovascular:     Rate and Rhythm: Normal rate.  Pulmonary:     Effort: Pulmonary effort is normal.  Abdominal:     Palpations: Abdomen is soft.  Musculoskeletal:     Cervical back: No rigidity.  Skin:    General: Skin is warm and dry.  Neurological:     Mental Status: She is alert and oriented to person, place, and time.  Psychiatric:        Behavior: Behavior normal.    Ortho Exam patient has some sciatic notch tenderness no trochanteric bursal tenderness.  Lower extremity reflexes are 2+ symmetrical.  She is ambulatory without limp.  No limitation of internal rotation of her hips.  Specialty Comments:  No specialty comments available.  Imaging: No results found.   PMFS History: Patient Active Problem List   Diagnosis Date Noted   Nasal congestion 01/21/2016   Bacterial vaginitis 01/21/2016   Allergic rhinitis 12/20/2015   Healthcare maintenance 12/20/2015   Acute sinusitis  12/20/2015   Past Medical History:  Diagnosis Date   Diverticulosis of colon    Internal hemorrhoid, bleeding    Seasonal allergic rhinitis    Wears partial dentures    upper    Family History  Problem Relation Age of Onset   Stroke Mother 78   Cancer Mother        unsure what type   High blood pressure Mother    Breast cancer Half-Sister    Colon cancer Neg Hx    Esophageal cancer Neg Hx    Stomach cancer Neg Hx    Rectal cancer Neg Hx     Past Surgical History:  Procedure Laterality Date   COLONOSCOPY  last one 03-21-2019  dr nandigam   LAPAROSCOPIC ABDOMINAL EXPLORATION  1986   NASAL SINUS SURGERY  2019   TRANSANAL HEMORRHOIDAL DEARTERIALIZATION N/A 06/02/2019   Procedure: TRANSANAL HEMORRHOIDAL DEARTERIALIZATION;  Surgeon: Leighton Ruff, MD;  Location: [facility];  Service: General;  Laterality: N/A;    Social History   Occupational History   Occupation: Housekeeping  Tobacco Use   Smoking status: Some Days    Years: 10.00    Types: Cigarettes   Smokeless tobacco: Never   Tobacco comments:    3 cigs per week  Vaping Use   Vaping Use: Never used  Substance and Sexual Activity   Alcohol use: Yes    Comment: occasional   Drug use: No   Sexual activity: Yes    Partners: Male

## 2021-11-18 ENCOUNTER — Ambulatory Visit: Payer: PRIVATE HEALTH INSURANCE

## 2021-12-03 ENCOUNTER — Ambulatory Visit (INDEPENDENT_AMBULATORY_CARE_PROVIDER_SITE_OTHER): Payer: PRIVATE HEALTH INSURANCE | Admitting: Primary Care

## 2021-12-03 ENCOUNTER — Encounter (INDEPENDENT_AMBULATORY_CARE_PROVIDER_SITE_OTHER): Payer: Self-pay | Admitting: Primary Care

## 2021-12-03 ENCOUNTER — Other Ambulatory Visit: Payer: Self-pay

## 2021-12-03 VITALS — BP 111/73 | HR 95 | Temp 98.0°F | Ht 62.0 in | Wt 121.4 lb

## 2021-12-03 DIAGNOSIS — J320 Chronic maxillary sinusitis: Secondary | ICD-10-CM

## 2021-12-03 DIAGNOSIS — M5440 Lumbago with sciatica, unspecified side: Secondary | ICD-10-CM

## 2021-12-03 MED ORDER — ALBUTEROL SULFATE HFA 108 (90 BASE) MCG/ACT IN AERS: 2.0000 | INHALATION_SPRAY | Freq: Four times a day (QID) | RESPIRATORY_TRACT | 2 refills | Status: DC | PRN

## 2021-12-03 MED ORDER — AMOXICILLIN-POT CLAVULANATE 875-125 MG PO TABS: 1.0000 | ORAL_TABLET | Freq: Two times a day (BID) | ORAL | 0 refills | Status: DC

## 2021-12-03 MED ORDER — DM-GUAIFENESIN ER 30-600 MG PO TB12: 1.0000 | ORAL_TABLET | Freq: Two times a day (BID) | ORAL | 1 refills | Status: DC

## 2021-12-03 MED ORDER — FLUTICASONE PROPIONATE 50 MCG/ACT NA SUSP: 2.0000 | Freq: Every day | NASAL | 1 refills | Status: DC

## 2021-12-09 ENCOUNTER — Encounter (INDEPENDENT_AMBULATORY_CARE_PROVIDER_SITE_OTHER): Payer: Self-pay | Admitting: Primary Care

## 2021-12-09 NOTE — Progress Notes (Signed)
? [place] ? ?Susan Coleman, is a 61 y.o. female ? ?YHC:[id number] ? ?DVV:[id number] ? ?DOB - Dec 17, 1960 ? ?Chief Complaint  ?Patient presents with  ? Back Pain  ? Sinus Problem  ?    ? ?Subjective:  ? ?Susan Coleman is a 61 y.o. female here today for a back pain and rhinitis, cough and watery eyes. Patient has No headache, No chest pain, No abdominal pain - No Nausea, No new weakness tingling or numbness. Back pain  ? ?No problems updated. ? ?ALLERGIES: ?No Known Allergies ? ?PAST MEDICAL HISTORY: ?Past Medical History:  ?Diagnosis Date  ? Diverticulosis of colon   ? Internal hemorrhoid, bleeding   ? Seasonal allergic rhinitis   ? Wears partial dentures   ? upper  ? ? ?MEDICATIONS AT HOME: ?Prior to Admission medications   ?Medication Sig Start Date End Date Taking? Authorizing Provider  ?albuterol (VENTOLIN HFA) 108 (90 Base) MCG/ACT inhaler Inhale 2 puffs into the lungs every 6 (six) hours as needed for wheezing or shortness of breath. 12/03/21  Yes Kerin Perna, NP  ?amoxicillin-clavulanate (AUGMENTIN) 875-125 MG tablet Take 1 tablet by mouth 2 (two) times daily. 12/03/21   Kerin Perna, NP  ?dextromethorphan-guaiFENesin [facility] DM) 30-600 MG 12hr tablet Take 1 tablet by mouth 2 (two) times daily. 12/03/21   Kerin Perna, NP  ?fluticasone (FLONASE) 50 MCG/ACT nasal spray Place 2 sprays into both nostrils daily. 12/03/21   Kerin Perna, NP  ?Multiple Vitamins-Minerals (ALIVE WOMENS 50+) CHEW Chew by mouth daily. 2 gummies daily    [provider]  ?psyllium (METAMUCIL SMOOTH TEXTURE) 58.6 % powder Take 1 packet by mouth 3 (three) times daily. 03/17/93   Leighton Ruff, MD  ?levocetirizine (XYZAL) 5 MG tablet Take 5 mg by mouth every evening.  08/11/20  [provider]  ? ? ?Objective:  ? ?Vitals:  ? 12/03/21 1555  ?BP: 111/73  ?Pulse: 95  ?Temp: 98 ?F (36.7 ?C)  ?TempSrc: Oral  ?SpO2: 100%  ?Weight: 121 lb 6.4 oz (55.1 kg)  ?Height: '5\' 2"'$  (1.575 m)   ? ?Exam ?General appearance : Awake, alert, not in any distress. Speech Clear. Not toxic looking ?HEENT: Atraumatic and Normocephalic, pupils equally reactive to light and accomodation. Maxillary tenderness. ?Neck: Supple, no JVD. No cervical lymphadenopathy.  ?Chest: Good air entry bilaterally, no added sounds  ?CVS: S1 S2 regular, no murmurs.  ?Abdomen: Bowel sounds present, Non tender and not distended with no gaurding, rigidity or rebound. ?Extremities: B/L Lower Ext shows no edema, both legs are warm to touch ?Neurology: Awake alert, and oriented X 3,  Non focal ?Skin: No Rash ? ?Data Review ?Lab Results  ?Component Value Date  ? HGBA1C 5.3 08/27/2020  ? ?Susan Coleman was seen today for back pain and sinus problem. ? ?Diagnoses and all orders for this visit: ? ?Assessment & Plan  ? ?1. Chronic maxillary sinusitis ?- amoxicillin-clavulanate (AUGMENTIN) 875-125 MG tablet; Take 1 tablet by mouth 2 (two) times daily.  Dispense: 20 tablet; Refill: 0 ?- dextromethorphan-guaiFENesin (MUCINEX DM) 30-600 MG 12hr tablet; Take 1 tablet by mouth 2 (two) times daily.  Dispense: 60 tablet; Refill: 1 ?- fluticasone (FLONASE) 50 MCG/ACT nasal spray; Place 2 sprays into both nostrils daily.  Dispense: 16 g; Refill: 1 ?- albuterol (VENTOLIN HFA) 108 (90 Base) MCG/ACT inhaler; Inhale 2 puffs into the lungs every 6 (six) hours as needed for wheezing or shortness of breath.  Dispense: 18 g; Refill: 2 ? ?Chronic  low back  pain with sciatica, sciatica laterality unspecified ?Followed by Dr. Lorin Mercy  ? ? Reviewed xrays  L4-5 disc space narrowing and facet arthropathy. She was  up for some physical therapy and follow-up after imaging.   ?( Retrieved from Dr. Lorin Mercy note-She may have some lateral recess or foraminal compression consistent with her symptom)  ? ? ?Patient have been counseled extensively about nutrition and exercise. Other issues discussed during this visit include: low cholesterol diet, weight control and daily exercise, foot  care, annual eye examinations at Ophthalmology, importance of adherence with medications and regular follow-up. We also discussed long term complications of uncontrolled diabetes and hypertension.  ? ?No follow-ups on file. ? ?The patient was given clear instructions to go to ER or return to medical center if symptoms don't improve, worsen or new problems develop. The patient verbalized understanding. The patient was told to call to get lab results if they haven't heard anything in the next week.  ? ?This note has been created with Surveyor, quantity. Any transcriptional errors are unintentional.  ? ?Kerin Perna, NP ?12/09/2021, 12:42 PM  ?

## 2022-01-14 ENCOUNTER — Telehealth (INDEPENDENT_AMBULATORY_CARE_PROVIDER_SITE_OTHER): Payer: Self-pay

## 2022-01-14 NOTE — Telephone Encounter (Signed)
Copied from [place] [id number]. Topic: Referral - Question ?>> Jan 14, 2022  3:32 PM Pawlus, Brayton Layman A wrote: ?Reason for CRM: Pt stated she was advised to contact the office once she needs a referral sent in for a back doctors. Pt wanted a referral sent to [place], Phone: [phone] ?

## 2022-01-16 ENCOUNTER — Other Ambulatory Visit (INDEPENDENT_AMBULATORY_CARE_PROVIDER_SITE_OTHER): Payer: Self-pay | Admitting: Primary Care

## 2022-01-16 DIAGNOSIS — M5440 Lumbago with sciatica, unspecified side: Secondary | ICD-10-CM

## 2022-02-14 ENCOUNTER — Other Ambulatory Visit (INDEPENDENT_AMBULATORY_CARE_PROVIDER_SITE_OTHER): Payer: Self-pay | Admitting: Primary Care

## 2022-02-14 DIAGNOSIS — J320 Chronic maxillary sinusitis: Secondary | ICD-10-CM

## 2022-02-18 NOTE — Telephone Encounter (Signed)
Requested medications are due for refill today.  yes  Requested medications are on the active medications list.  yes  Last refill. 12/03/2021 #60 1 refill  Future visit scheduled.   no  Notes to clinic.  No protocol for this medication - please review for refill    Requested Prescriptions  Pending Prescriptions Disp Refills   Dextromethorphan-guaiFENesin ([facility] MUCUS DM EXTENDED RELEASE) 30-600 MG TB12 [Pharmacy Med Name: [facility] MUCUS DM ER 600-30 MG TAB] 68 tablet 1    Sig: TAKE 1 TABLET BY MOUTH TWICE A DAY     There is no refill protocol information for this order

## 2022-03-20 ENCOUNTER — Encounter (INDEPENDENT_AMBULATORY_CARE_PROVIDER_SITE_OTHER): Payer: Self-pay | Admitting: Primary Care

## 2022-03-20 ENCOUNTER — Ambulatory Visit (INDEPENDENT_AMBULATORY_CARE_PROVIDER_SITE_OTHER): Payer: Self-pay | Admitting: Primary Care

## 2022-03-20 VITALS — BP 123/84 | HR 89 | Temp 98.0°F | Ht 62.0 in | Wt 125.0 lb

## 2022-03-20 DIAGNOSIS — J320 Chronic maxillary sinusitis: Secondary | ICD-10-CM

## 2022-03-20 DIAGNOSIS — J0140 Acute pansinusitis, unspecified: Secondary | ICD-10-CM

## 2022-03-20 MED ORDER — FLUTICASONE PROPIONATE 50 MCG/ACT NA SUSP: 2.0000 | Freq: Every day | NASAL | 1 refills | Status: DC

## 2022-03-20 MED ORDER — ALBUTEROL SULFATE HFA 108 (90 BASE) MCG/ACT IN AERS: 2.0000 | INHALATION_SPRAY | Freq: Four times a day (QID) | RESPIRATORY_TRACT | 2 refills | Status: DC | PRN

## 2022-03-20 MED ORDER — AMOXICILLIN-POT CLAVULANATE 875-125 MG PO TABS: 1.0000 | ORAL_TABLET | Freq: Two times a day (BID) | ORAL | 0 refills | Status: DC

## 2022-03-20 NOTE — Progress Notes (Signed)
     [facility]   Subjective:     Susan Coleman is a 61 y.o. female who presents for evaluation of sinus pain. Symptoms include: congestion, facial pain, frequent clearing of the throat, headaches, itchy eyes, nasal congestion, sinus pressure, and sniffing. Onset of symptoms was 2 weeks ago. Symptoms have been gradually worsening since that time. Past history is significant for  pansinusitis . Patient is a smoker  (1/4 ppd x 3 yrs). Patient has No headache, No chest pain, No abdominal pain - No Nausea, No new weakness tingling or numbness, No Cough - shortness of breath   The following portions of the patient's history were reviewed and updated as appropriate: allergies, current medications, past family history, past medical history, past social history, and past surgical history.  Review of Systems Pertinent items noted in HPI and remainder of comprehensive ROS otherwise negative.   Objective:  BP 123/84   Pulse 89   Temp 98 F (36.7 C) (Oral)   Ht '5\' 2"'$  (1.575 m)   Wt 125 lb (56.7 kg)   LMP 09/29/2011   SpO2 100%   BMI 22.86 kg/m    BP 123/84   Pulse 89   Temp 98 F (36.7 C) (Oral)   Ht '5\' 2"'$  (1.575 m)   Wt 125 lb (56.7 kg)   LMP 09/29/2011   SpO2 100%   BMI 22.86 kg/m  General appearance: alert, appears older than stated age, and mild distress Head: Normocephalic, without obvious abnormality, atraumatic Nose: Nares normal. Septum midline. Mucosa normal. No drainage or sinus tenderness., right turbinate red, left turbinate red Throat: lips, mucosa, and tongue normal; teeth and gums normal Lungs: clear to auscultation bilaterally Abdomen: soft, non-tender; bowel sounds normal; no masses,  no organomegaly Extremities: extremities normal, atraumatic, no cyanosis or edema Skin: Skin color, texture, turgor normal. No rashes or lesions Lymph nodes: frontal, maxillary ,and cervical change tenderness  Neurologic: Alert and oriented X 3, normal strength and tone.  Normal symmetric reflexes. Normal coordination and gait    Assessment:  Susan Coleman was seen today for sinus problem.  Diagnoses and all orders for this visit:  Acute pansinusitis, recurrence not specified -     amoxicillin-clavulanate (AUGMENTIN) 875-125 MG tablet; Take 1 tablet by mouth 2 (two) times daily.    Acute bacterial sinusitis.   Nasal steroids per medication orders. Antihistamines per medication orders. Augmentin per medication orders. Referred to ENT.   This note has been created with Surveyor, quantity. Any transcriptional errors are unintentional.   Kerin Perna, NP 03/20/2022, 11:26 AM

## 2022-04-20 ENCOUNTER — Other Ambulatory Visit (INDEPENDENT_AMBULATORY_CARE_PROVIDER_SITE_OTHER): Payer: Self-pay | Admitting: Primary Care

## 2022-04-20 DIAGNOSIS — J320 Chronic maxillary sinusitis: Secondary | ICD-10-CM

## 2022-06-13 ENCOUNTER — Other Ambulatory Visit (INDEPENDENT_AMBULATORY_CARE_PROVIDER_SITE_OTHER): Payer: Self-pay | Admitting: Primary Care

## 2022-06-13 DIAGNOSIS — J320 Chronic maxillary sinusitis: Secondary | ICD-10-CM

## 2022-06-13 NOTE — Telephone Encounter (Signed)
Requested Prescriptions  Pending Prescriptions Disp Refills  . fluticasone (FLONASE) 50 MCG/ACT nasal spray [Pharmacy Med Name: FLUTICASONE PROP 50 MCG SPRAY] 16 mL 1    Sig: SPRAY 2 SPRAYS INTO EACH NOSTRIL EVERY DAY     Ear, Nose, and Throat: Nasal Preparations - Corticosteroids Passed - 06/13/2022 12:11 AM      Passed - Valid encounter within last 12 months    Recent Outpatient Visits          2 months ago Acute pansinusitis, recurrence not specified   [place] Juluis Mire P, NP   6 months ago Chronic low back pain with sciatica, sciatica laterality unspecified   [place] Kerin Perna, NP   8 months ago Annual physical exam   [place] Kerin Perna, NP   1 year ago Acute maxillary sinusitis, recurrence not specified   [place] Kerin Perna, NP   1 year ago Screening examination for STD (sexually transmitted disease)   [place] Kerin Perna, NP

## 2022-07-08 ENCOUNTER — Other Ambulatory Visit (INDEPENDENT_AMBULATORY_CARE_PROVIDER_SITE_OTHER): Payer: Self-pay | Admitting: Primary Care

## 2022-07-08 DIAGNOSIS — J320 Chronic maxillary sinusitis: Secondary | ICD-10-CM

## 2022-07-08 NOTE — Telephone Encounter (Signed)
Requested Prescriptions  Pending Prescriptions Disp Refills  . fluticasone (FLONASE) 50 MCG/ACT nasal spray [Pharmacy Med Name: FLUTICASONE PROP 50 MCG SPRAY] 48 mL 1    Sig: SPRAY 2 SPRAYS INTO EACH NOSTRIL EVERY DAY     Ear, Nose, and Throat: Nasal Preparations - Corticosteroids Passed - 07/08/2022  2:33 PM      Passed - Valid encounter within last 12 months    Recent Outpatient Visits          3 months ago Acute pansinusitis, recurrence not specified   [place] Juluis Mire P, NP   7 months ago Chronic low back pain with sciatica, sciatica laterality unspecified   [place] Kerin Perna, NP   9 months ago Annual physical exam   [place] Kerin Perna, NP   1 year ago Acute maxillary sinusitis, recurrence not specified   [place] Kerin Perna, NP   1 year ago Screening examination for STD (sexually transmitted disease)   [place] Kerin Perna, NP

## 2022-10-24 ENCOUNTER — Other Ambulatory Visit: Payer: Self-pay | Admitting: Internal Medicine

## 2022-10-24 DIAGNOSIS — M545 Low back pain, unspecified: Secondary | ICD-10-CM

## 2022-11-18 ENCOUNTER — Ambulatory Visit
Admission: RE | Admit: 2022-11-18 | Discharge: 2022-11-18 | Disposition: A | Discharge status: Home / Self Care | Payer: Medicaid Other | Source: Ambulatory Visit | Attending: Internal Medicine | Admitting: Internal Medicine

## 2022-11-18 DIAGNOSIS — M545 Low back pain, unspecified: Secondary | ICD-10-CM

## 2022-12-08 ENCOUNTER — Other Ambulatory Visit: Payer: Self-pay | Admitting: Internal Medicine

## 2022-12-08 DIAGNOSIS — Z1231 Encounter for screening mammogram for malignant neoplasm of breast: Secondary | ICD-10-CM

## 2022-12-09 ENCOUNTER — Ambulatory Visit
Admission: RE | Admit: 2022-12-09 | Discharge: 2022-12-09 | Disposition: A | Discharge status: Home / Self Care | Payer: Medicaid Other | Source: Ambulatory Visit | Attending: Internal Medicine | Admitting: Internal Medicine

## 2022-12-09 DIAGNOSIS — Z1231 Encounter for screening mammogram for malignant neoplasm of breast: Secondary | ICD-10-CM

## 2022-12-23 IMAGING — CT CT MAXILLOFACIAL W/O CM
1 series · 15 of 30 positions shown, 19 images · non-contrast
Comparison: None.

CLINICAL DATA: Maxillofacial pain, chronic sinusitis

EXAM:
CT MAXILLOFACIAL WITHOUT CONTRAST
TECHNIQUE: Multidetector CT imaging of the maxillofacial structures was
performed. Multiplanar CT image reconstructions were also generated.

[Series 4: soft tissue · axial · 0.34mm/px · z∈[-156,-57]mm · 15 of 107 slices shown, 19 images]
[im 4/107  brain]
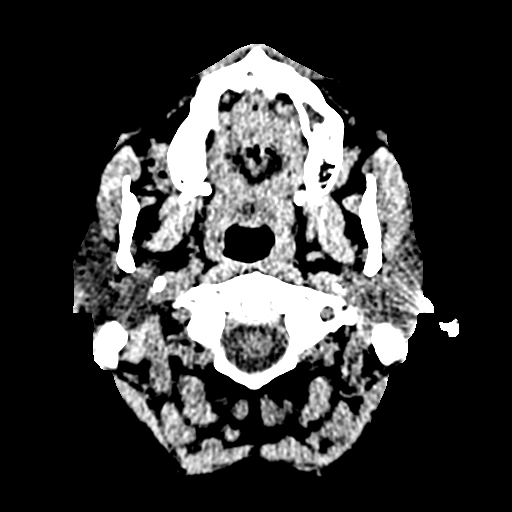
[im 4/107  bone]
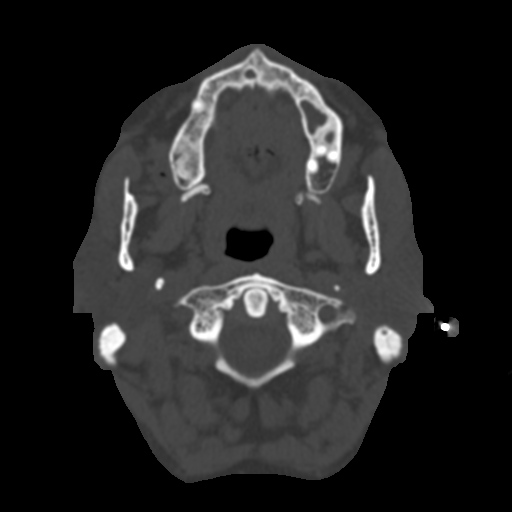
[im 11/107  bone]
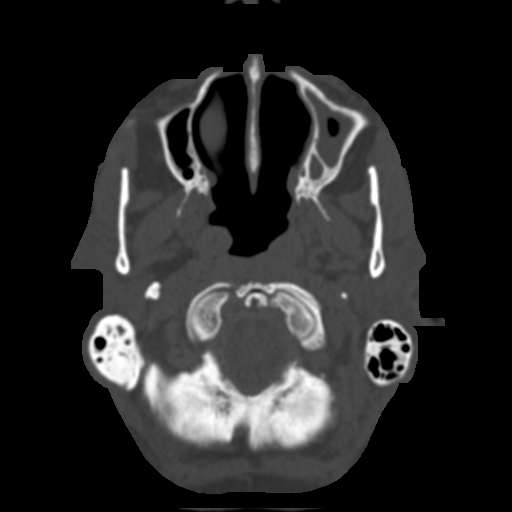
[im 19/107  bone]
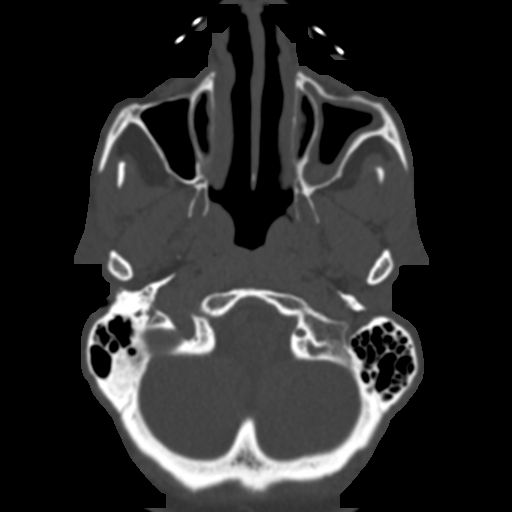
[im 26/107  bone]
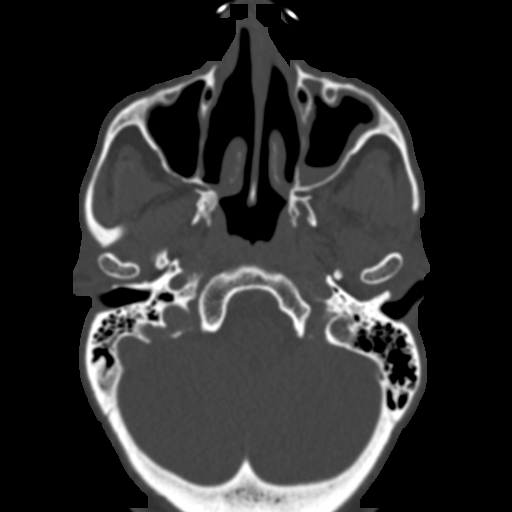
[im 33/107  brain]
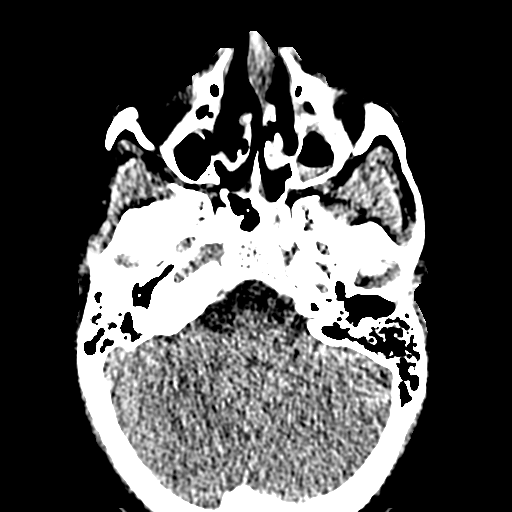
[im 33/107  bone]
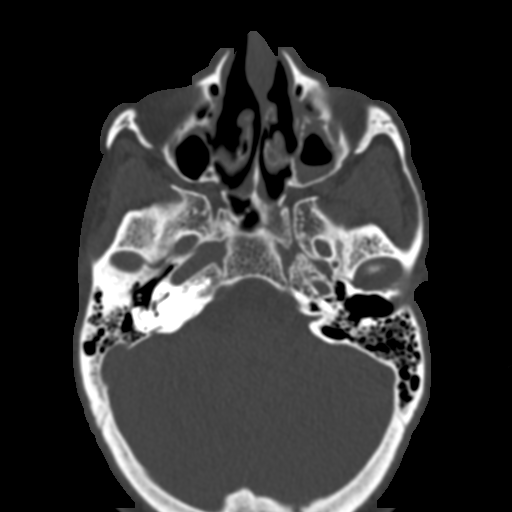
[im 41/107  bone]
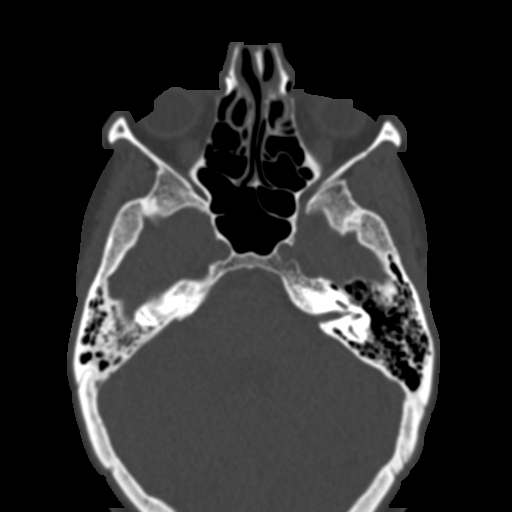
[im 48/107  bone]
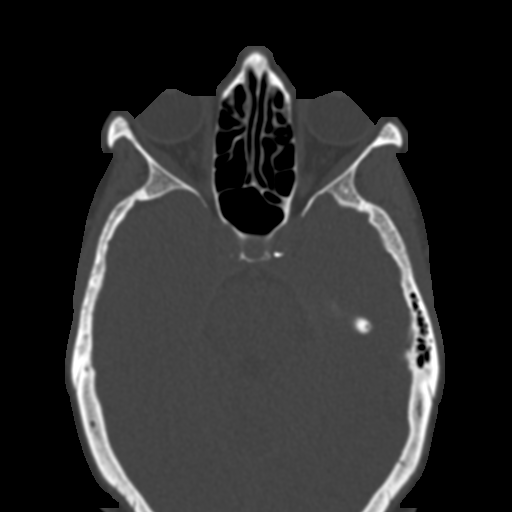
[im 55/107  bone]
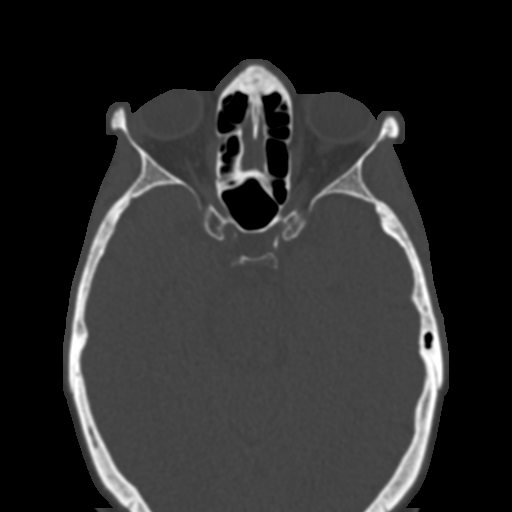
[im 59/107  brain]
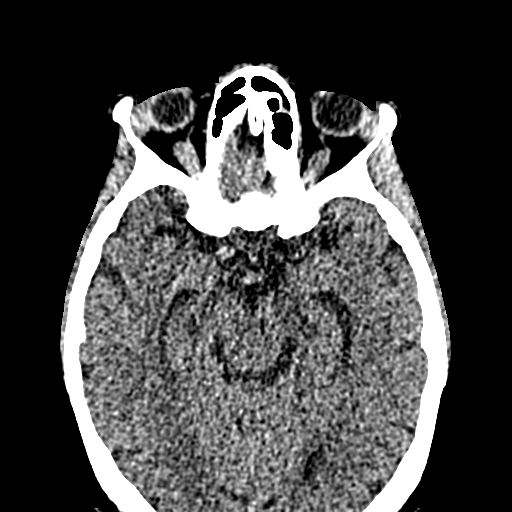
[im 59/107  bone]
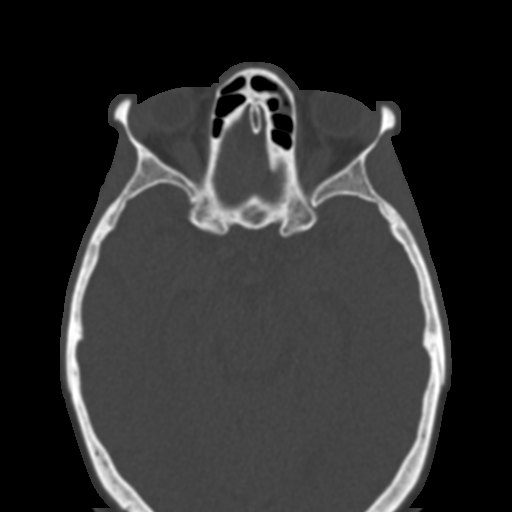
[im 66/107  bone]
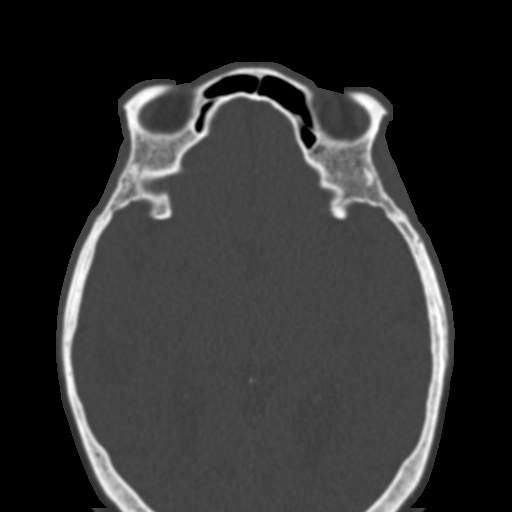
[im 74/107  bone]
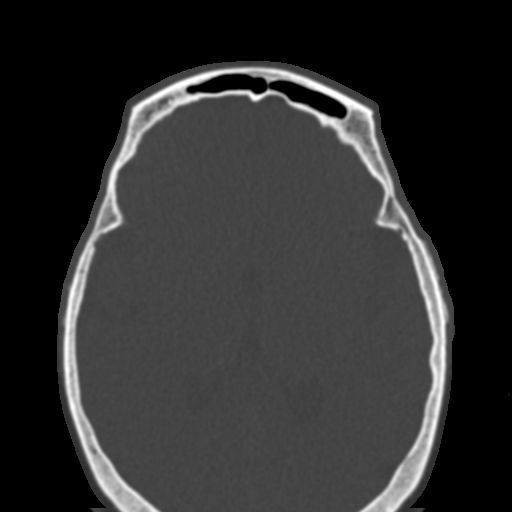
[im 81/107  bone]
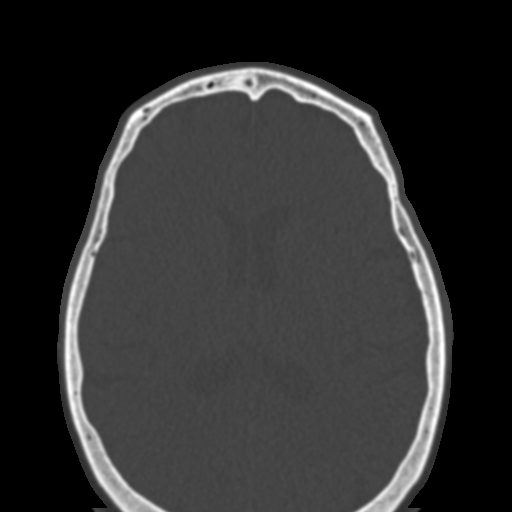
[im 88/107  brain]
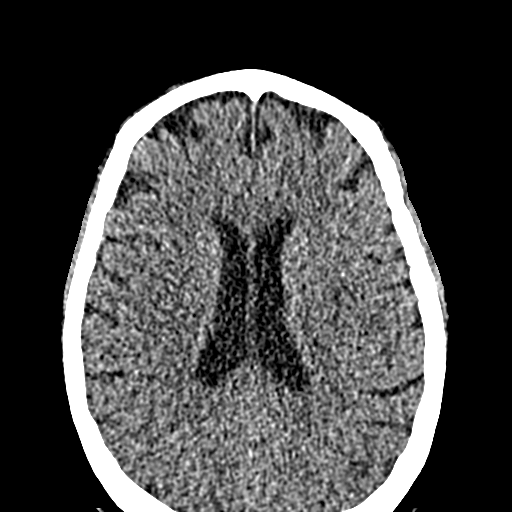
[im 88/107  bone]
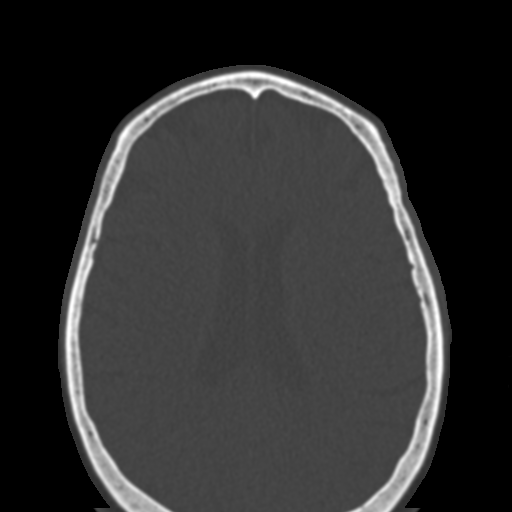
[im 96/107  bone]
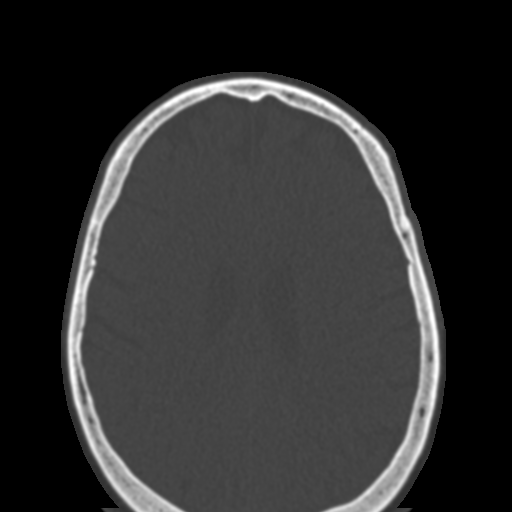
[im 103/107  bone]
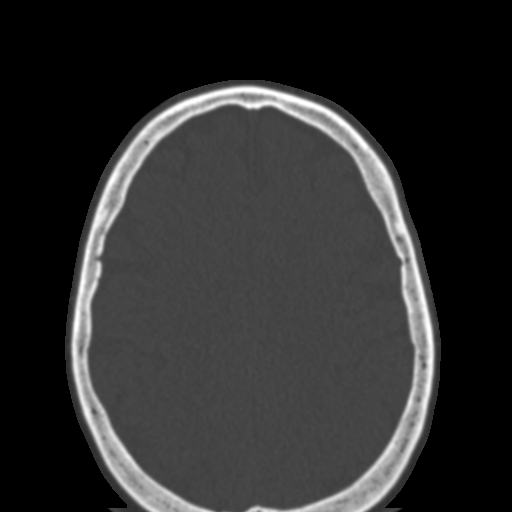

[15 of 30 positions shown; findings below may reference images not displayed]

FINDINGS: Paranasal sinuses:

Frontal: Aerated on the right. Mucosal thickening occludes the
ostium on the left. Patent frontal sinus drainage pathways.

Ethmoid: Mild mucosal thickening.

Maxillary: Mild right and moderate left circumferential mucosal
thickening. Small left air-fluid level. Occluded ostium bilaterally.

Sphenoid: Normally aerated. Patent sphenoethmoidal recesses.

Right ostiomeatal unit: Infundibulum is occluded.

Left ostiomeatal unit: Infundibulum is occluded

Nasal passages: Likely partial middle turbinectomies. Partial
apposition of the left middle turbinate to the nasal septum, which
is slightly deviated to the left.

Anatomy: Cribriform plate and fovea ethmoidalis are intact and
higher on the left. Lateral lamella measures up to 4 mm. Sellar
sphenoid pneumatization pattern. No dehiscence of carotid or optic
canals. No onodi cell.

Other: Temporomandibular joints are unremarkable. Mastoid air cells
and middle ears are clear. Limited intracranial imaging demonstrates
no acute abnormality.
IMPRESSION: Mild paranasal sinus inflammatory changes with narrowing or
occlusion of some drainage pathways. There is greatest involvement
of the maxillary sinuses with occluded drainage pathways and a small
air-fluid level on the left.

## 2023-02-21 ENCOUNTER — Ambulatory Visit (HOSPITAL_COMMUNITY): Payer: Self-pay

## 2023-04-25 ENCOUNTER — Ambulatory Visit (HOSPITAL_COMMUNITY)
Admission: RE | Admit: 2023-04-25 | Discharge: 2023-04-25 | Disposition: A | Discharge status: Home / Self Care | Payer: Medicaid Other | Source: Ambulatory Visit | Attending: Emergency Medicine | Admitting: Emergency Medicine

## 2023-04-25 ENCOUNTER — Encounter (HOSPITAL_COMMUNITY): Payer: Self-pay

## 2023-04-25 VITALS — BP 122/79 | HR 82 | Temp 98.2°F | Resp 16 | Ht 62.0 in | Wt 130.0 lb

## 2023-04-25 DIAGNOSIS — J0140 Acute pansinusitis, unspecified: Secondary | ICD-10-CM

## 2023-04-25 DIAGNOSIS — J320 Chronic maxillary sinusitis: Secondary | ICD-10-CM

## 2023-04-25 MED ORDER — PROMETHAZINE-DM 6.25-15 MG/5ML PO SYRP: 2.5000 mL | ORAL_SOLUTION | Freq: Four times a day (QID) | ORAL | 0 refills | Status: DC | PRN

## 2023-04-25 MED ORDER — BENZONATATE 100 MG PO CAPS: 100.0000 mg | ORAL_CAPSULE | Freq: Three times a day (TID) | ORAL | 0 refills | Status: DC

## 2023-04-25 MED ORDER — AMOXICILLIN-POT CLAVULANATE 875-125 MG PO TABS: 1.0000 | ORAL_TABLET | Freq: Two times a day (BID) | ORAL | 0 refills | Status: AC

## 2023-04-25 MED ORDER — FLUTICASONE PROPIONATE 50 MCG/ACT NA SUSP: 1.0000 | Freq: Every day | NASAL | 1 refills | Status: DC

## 2023-04-25 NOTE — Discharge Instructions (Signed)
Today you are being treated for a sinus infection  Begin Augmentin every morning and every evening for 10 days, takes approximately 48 hours for medicine to reach peak in the body, will see small improvements day by day  You may use Tessalon pill every 8 hours to help calm your coughing  You may use cough syrup every 6 hours as needed for additional comfort, be mindful this may make you feel drowsy  You may use nasal spray every morning to help clear out the sinuses    You can take Tylenol and/or Ibuprofen as needed for fever reduction and pain relief.   For cough: honey 1/2 to 1 teaspoon (you can dilute the honey in water or another fluid).  You can also use guaifenesin  for cough. You can use a humidifier for chest congestion and cough.  If you don't have a humidifier, you can sit in the bathroom with the hot shower running.      For sore throat: try warm salt water gargles, cepacol lozenges, throat spray, warm tea or water with lemon/honey, popsicles or ice, or OTC cold relief medicine for throat discomfort.   For congestion: take a daily anti-histamine like Zyrtec, Claritin, and a oral decongestant, such as pseudoephedrine.     It is important to stay hydrated: drink plenty of fluids (water, gatorade/powerade/pedialyte, juices, or teas) to keep your throat moisturized and help further relieve irritation/discomfort.

## 2023-04-25 NOTE — ED Provider Notes (Signed)
[facility]    CSN: [id number] Arrival date & time: 04/25/23  1229      History   Chief Complaint Chief Complaint  Patient presents with   Nasal Congestion    HPI Susan Coleman is a 62 y.o. female.   Patient presents for evaluation of chills, nasal congestion, rhinorrhea, bilateral ear pain and fullness, sinus pain and pressure, sore throat, cough, headaches and diarrhea present for 2 weeks.  Sinus pain and pressure primarily along the forehead and behind the eyes.  Cough is productive with thick colored sputum, accompanying shortness of breath and wheezing only present when coughing.  Diarrhea described as soft with last occurrence 2 days ago.  Decreased appetite but tolerating food and liquids.  No known sick contacts prior.  Has attempted use of Mucinex and nasal spray which has been somewhat helpful.  History of reoccurring sinusitis.   Past Medical History:  Diagnosis Date   Diverticulosis of colon    Internal hemorrhoid, bleeding    Seasonal allergic rhinitis    Wears partial dentures    upper    Patient Active Problem List   Diagnosis Date Noted   Nasal congestion 01/21/2016   Bacterial vaginitis 01/21/2016   Allergic rhinitis 12/20/2015   Healthcare maintenance 12/20/2015   Acute sinusitis 12/20/2015    Past Surgical History:  Procedure Laterality Date   COLONOSCOPY  last one 03-21-2019  dr nandigam   LAPAROSCOPIC ABDOMINAL EXPLORATION  1986   NASAL SINUS SURGERY  2019   TRANSANAL HEMORRHOIDAL DEARTERIALIZATION N/A 06/02/2019   Procedure: TRANSANAL HEMORRHOIDAL DEARTERIALIZATION;  Surgeon: Romie Levee, MD;  Location: [facility];  Service: General;  Laterality: N/A;    OB History     Gravida  2   Para  2   Term  2   Preterm      AB      Living  2      SAB      IAB      Ectopic      Multiple      Live Births               Home Medications    Prior to Admission medications   Medication Sig Start Date  End Date Taking? Authorizing Provider  Multiple Vitamins-Minerals (ALIVE WOMENS 50+) CHEW Chew by mouth daily. 2 gummies daily   Yes [provider]  albuterol (VENTOLIN HFA) 108 (90 Base) MCG/ACT inhaler Inhale 2 puffs into the lungs every 6 (six) hours as needed for wheezing or shortness of breath. 03/20/22   Grayce Sessions, NP  amoxicillin-clavulanate (AUGMENTIN) 875-125 MG tablet Take 1 tablet by mouth 2 (two) times daily. 03/20/22   Grayce Sessions, NP  Dextromethorphan-guaiFENesin ([employer] MUCUS DM EXTENDED RELEASE) 30-600 MG TB12 TAKE 1 TABLET BY MOUTH TWICE A DAY 02/18/22   Grayce Sessions, NP  fluticasone [facility]) 50 MCG/ACT nasal spray SPRAY 2 SPRAYS INTO EACH NOSTRIL EVERY DAY 07/08/22   Grayce Sessions, NP  psyllium (METAMUCIL SMOOTH TEXTURE) 58.6 % powder Take 1 packet by mouth 3 (three) times daily. 06/02/19   Romie Levee, MD  levocetirizine (XYZAL) 5 MG tablet Take 5 mg by mouth every evening.  08/11/20  [provider]    Family History Family History  Problem Relation Age of Onset   Stroke Mother 43   Cancer Mother        unsure what type   High blood pressure Mother    Breast  cancer Half-Sister    Colon cancer Neg Hx    Esophageal cancer Neg Hx    Stomach cancer Neg Hx    Rectal cancer Neg Hx     Social History Social History   Tobacco Use   Smoking status: Some Days    Types: Cigarettes   Smokeless tobacco: Never   Tobacco comments:    3 cigs per week  Vaping Use   Vaping status: Never Used  Substance Use Topics   Alcohol use: Yes    Comment: occasional   Drug use: No     Allergies   Patient has no known allergies.   Review of Systems Review of Systems  Constitutional:  Positive for chills. Negative for activity change, appetite change, diaphoresis, fatigue, fever and unexpected weight change.  HENT:  Positive for congestion, ear pain, rhinorrhea, sinus pressure, sinus pain and sore throat. Negative for dental problem,  drooling, ear discharge, facial swelling, hearing loss, mouth sores, nosebleeds, postnasal drip, sneezing, tinnitus, trouble swallowing and voice change.   Respiratory:  Positive for cough. Negative for apnea, choking, chest tightness, shortness of breath, wheezing and stridor.   Cardiovascular: Negative.   Gastrointestinal:  Positive for diarrhea. Negative for abdominal distention, abdominal pain, anal bleeding, blood in stool, constipation, nausea, rectal pain and vomiting.  Skin: Negative.   Neurological:  Positive for headaches. Negative for dizziness, tremors, seizures, syncope, facial asymmetry, speech difficulty, weakness, light-headedness and numbness.     Physical Exam Triage Vital Signs ED Triage Vitals  Encounter Vitals Group     BP 04/25/23 1303 122/79     Systolic BP Percentile --      Diastolic BP Percentile --      Pulse Rate 04/25/23 1303 82     Resp 04/25/23 1303 16     Temp 04/25/23 1303 98.2 F (36.8 C)     Temp Source 04/25/23 1303 Oral     SpO2 04/25/23 1303 94 %     Weight 04/25/23 1303 130 lb (59 kg)     Height 04/25/23 1303 5\' 2"  (1.575 m)     Head Circumference --      Peak Flow --      Pain Score 04/25/23 1302 9     Pain Loc --      Pain Education --      Exclude from Growth Chart --    No data found.  Updated Vital Signs BP 122/79 (BP Location: Right Arm)   Pulse 82   Temp 98.2 F (36.8 C) (Oral)   Resp 16   Ht 5\' 2"  (1.575 m)   Wt 130 lb (59 kg)   LMP 09/29/2011   SpO2 94%   BMI 23.78 kg/m   Visual Acuity Right Eye Distance:   Left Eye Distance:   Bilateral Distance:    Right Eye Near:   Left Eye Near:    Bilateral Near:     Physical Exam Constitutional:      Appearance: Normal appearance.  HENT:     Head: Normocephalic.     Right Ear: Hearing, tympanic membrane, ear canal and external ear normal.     Left Ear: Hearing, tympanic membrane, ear canal and external ear normal.     Nose:     Right Turbinates: Swollen.     Left  Turbinates: Swollen.     Right Sinus: Maxillary sinus tenderness and frontal sinus tenderness present.     Left Sinus: Maxillary sinus tenderness and frontal sinus tenderness present.  Mouth/Throat:     Pharynx: No posterior oropharyngeal erythema.     Tonsils: No tonsillar exudate. 2+ on the right. 2+ on the left.  Eyes:     Extraocular Movements: Extraocular movements intact.  Cardiovascular:     Rate and Rhythm: Normal rate and regular rhythm.     Pulses: Normal pulses.     Heart sounds: Normal heart sounds.  Pulmonary:     Effort: Pulmonary effort is normal.     Breath sounds: Normal breath sounds.  Musculoskeletal:        General: Normal range of motion.  Skin:    General: Skin is warm and dry.  Neurological:     General: No focal deficit present.     Mental Status: She is alert and oriented to person, place, and time. Mental status is at baseline.  Psychiatric:        Mood and Affect: Mood normal.        Behavior: Behavior normal.      UC Treatments / Results  Labs (all labs ordered are listed, but only abnormal results are displayed) Labs Reviewed - No data to display  EKG   Radiology No results found.  Procedures Procedures (including critical care time)  Medications Ordered in UC Medications - No data to display  Initial Impression / Assessment and Plan / UC Course  I have reviewed the triage vital signs and the nursing notes.  Pertinent labs & imaging results that were available during my care of the patient were reviewed by me and considered in my medical decision making (see chart for details).  Acute nonrecurrent pansinusitis  Patient is in no signs of distress nor toxic appearing.  Vital signs are stable.  Low suspicion for pneumonia, pneumothorax or bronchitis and therefore will defer imaging.  Viral testing deferred due to timeline of illness.  Symptoms consistent with sinusitis and have been present for 2 weeks without signs of resolution.   Prescribed Augmentin, Flonase, Tessalon and Promethazine DM.May use additional over-the-counter medications as needed for supportive care.  May follow-up with urgent care as needed if symptoms persist or worsen.   Final Clinical Impressions(s) / UC Diagnoses   Final diagnoses:  None   Discharge Instructions   None    ED Prescriptions   None    PDMP not reviewed this encounter.   Valinda Hoar, NP 04/25/23 1348

## 2023-04-25 NOTE — ED Triage Notes (Signed)
Patient here today with c/o nasal congestion, runny nose, ST, otalgia, headache, chills, sweats, body aches, and fatigue X 2 weeks. Symptoms have worsened over the last week. Orest Dikes been taking Mucinex, and a nasal spray with no relief. No sick contacts or recent travel. She has a h/o sinusitis.

## 2023-06-04 ENCOUNTER — Ambulatory Visit (HOSPITAL_COMMUNITY): Payer: Self-pay

## 2023-07-07 ENCOUNTER — Ambulatory Visit (HOSPITAL_COMMUNITY)
Admission: EM | Admit: 2023-07-07 | Discharge: 2023-07-07 | Disposition: A | Discharge status: Home / Self Care | Payer: Medicaid Other | Attending: Internal Medicine | Admitting: Internal Medicine

## 2023-07-07 ENCOUNTER — Encounter (HOSPITAL_COMMUNITY): Payer: Self-pay | Admitting: Emergency Medicine

## 2023-07-07 DIAGNOSIS — J0101 Acute recurrent maxillary sinusitis: Secondary | ICD-10-CM | POA: Diagnosis not present

## 2023-07-07 MED ORDER — AMOXICILLIN-POT CLAVULANATE 875-125 MG PO TABS: 1.0000 | ORAL_TABLET | Freq: Two times a day (BID) | ORAL | 0 refills | Status: DC

## 2023-07-07 NOTE — ED Provider Notes (Signed)
[facility]    CSN: [id number] Arrival date & time: 07/07/23  1711      History   Chief Complaint Chief Complaint  Patient presents with   Nasal Congestion   Headache   Sore Throat    HPI Susan Coleman is a 62 y.o. female.    Headache Associated symptoms: congestion, cough, drainage, sinus pressure, sore throat and vomiting (Episode of vomiting several days ago)   Associated symptoms: no fatigue, no fever and no photophobia   Sore Throat Associated symptoms include headaches. Pertinent negatives include no chest pain and no shortness of breath.  Nasal congestion, postnasal drip, sinus pain and pressure for 3 weeks associated with cough, headache and dizziness.  Denies documented fever, chills, sweats, chest pain, shortness of breath, abdominal pain, nausea, vomiting, diarrhea.  Admits recurrent sinusitis has had 2 sinus surgeries in the past.  Denies known contacts with illness.  Past Medical History:  Diagnosis Date   Diverticulosis of colon    Internal hemorrhoid, bleeding    Seasonal allergic rhinitis    Wears partial dentures    upper    Patient Active Problem List   Diagnosis Date Noted   Nasal congestion 01/21/2016   Bacterial vaginitis 01/21/2016   Allergic rhinitis 12/20/2015   Healthcare maintenance 12/20/2015   Acute sinusitis 12/20/2015    Past Surgical History:  Procedure Laterality Date   COLONOSCOPY  last one 03-21-2019  dr nandigam   LAPAROSCOPIC ABDOMINAL EXPLORATION  1986   NASAL SINUS SURGERY  2019   TRANSANAL HEMORRHOIDAL DEARTERIALIZATION N/A 06/02/2019   Procedure: TRANSANAL HEMORRHOIDAL DEARTERIALIZATION;  Surgeon: Romie Levee, MD;  Location: [facility] [place];  Service: General;  Laterality: N/A;    OB History     Gravida  2   Para  2   Term  2   Preterm      AB      Living  2      SAB      IAB      Ectopic      Multiple      Live Births               Home Medications    Prior to  Admission medications   Medication Sig Start Date End Date Taking? Authorizing Provider  amoxicillin-clavulanate (AUGMENTIN) 875-125 MG tablet Take 1 tablet by mouth every 12 (twelve) hours. 07/07/23  Yes Meliton Rattan, PA  benzonatate (TESSALON) 100 MG capsule Take 1 capsule (100 mg total) by mouth every 8 (eight) hours. 04/25/23   Valinda Hoar, NP  Dextromethorphan-guaiFENesin ([employer] MUCUS DM EXTENDED RELEASE) 30-600 MG TB12 TAKE 1 TABLET BY MOUTH TWICE A DAY 02/18/22   Grayce Sessions, NP  fluticasone (FLONASE) 50 MCG/ACT nasal spray Place 1 spray into both nostrils daily. 04/25/23   White, Elita Boone, NP  Multiple Vitamins-Minerals (ALIVE WOMENS 50+) CHEW Chew by mouth daily. 2 gummies daily    [provider]  promethazine-dextromethorphan (PROMETHAZINE-DM) 6.25-15 MG/5ML syrup Take 2.5 mLs by mouth 4 (four) times daily as needed for cough. Patient not taking: Reported on 07/07/2023 04/25/23   Valinda Hoar, NP  psyllium (METAMUCIL SMOOTH TEXTURE) 58.6 % powder Take 1 packet by mouth 3 (three) times daily. 06/02/19   Romie Levee, MD  levocetirizine (XYZAL) 5 MG tablet Take 5 mg by mouth every evening.  08/11/20  [provider]    Family History Family History  Problem Relation Age of Onset   Stroke Mother  31   Cancer Mother        unsure what type   High blood pressure Mother    Breast cancer Half-Sister    Colon cancer Neg Hx    Esophageal cancer Neg Hx    Stomach cancer Neg Hx    Rectal cancer Neg Hx     Social History Social History   Tobacco Use   Smoking status: Some Days    Types: Cigarettes   Smokeless tobacco: Never   Tobacco comments:    3 cigs per week  Vaping Use   Vaping status: Never Used  Substance Use Topics   Alcohol use: Yes    Comment: occasional   Drug use: No     Allergies   Patient has no known allergies.   Review of Systems Review of Systems  Constitutional:  Negative for chills, fatigue and fever.  HENT:   Positive for congestion, postnasal drip, sinus pressure, sinus pain and sore throat. Negative for dental problem, ear discharge, rhinorrhea and trouble swallowing.   Eyes:  Negative for photophobia and discharge.  Respiratory:  Positive for cough. Negative for shortness of breath.   Cardiovascular:  Negative for chest pain.  Gastrointestinal:  Positive for vomiting (Episode of vomiting several days ago).  Skin:  Negative for rash.  Neurological:  Positive for headaches.     Physical Exam Triage Vital Signs ED Triage Vitals  Encounter Vitals Group     BP 07/07/23 1755 133/79     Systolic BP Percentile --      Diastolic BP Percentile --      Pulse Rate 07/07/23 1755 95     Resp 07/07/23 1755 17     Temp 07/07/23 1755 98.7 F (37.1 C)     Temp Source 07/07/23 1755 Oral     SpO2 07/07/23 1755 98 %     Weight --      Height --      Head Circumference --      Peak Flow --      Pain Score 07/07/23 1753 9     Pain Loc --      Pain Education --      Exclude from Growth Chart --    No data found.  Updated Vital Signs BP 133/79 (BP Location: Right Arm)   Pulse 95   Temp 98.7 F (37.1 C) (Oral)   Resp 17   LMP 09/29/2011   SpO2 98%   Visual Acuity Right Eye Distance:   Left Eye Distance:   Bilateral Distance:    Right Eye Near:   Left Eye Near:    Bilateral Near:     Physical Exam Vitals and nursing note reviewed.  Constitutional:      Appearance: She is not ill-appearing.  HENT:     Head: Normocephalic and atraumatic.     Nose: Congestion present. No nasal deformity, septal deviation or rhinorrhea.     Right Sinus: Maxillary sinus tenderness present. No frontal sinus tenderness.     Left Sinus: Maxillary sinus tenderness present. No frontal sinus tenderness.  Eyes:     Extraocular Movements: Extraocular movements intact.  Cardiovascular:     Rate and Rhythm: Normal rate and regular rhythm.     Heart sounds: Normal heart sounds.  Pulmonary:     Effort: No  respiratory distress.     Breath sounds: Normal breath sounds. No wheezing.  Musculoskeletal:     Cervical back: Normal range of motion and neck supple.  Lymphadenopathy:     Cervical: No cervical adenopathy.  Neurological:     Mental Status: She is alert.  Psychiatric:        Mood and Affect: Mood normal.      UC Treatments / Results  Labs (all labs ordered are listed, but only abnormal results are displayed) Labs Reviewed - No data to display  EKG   Radiology No results found.  Procedures Procedures (including critical care time)  Medications Ordered in UC Medications - No data to display  Initial Impression / Assessment and Plan / UC Course  I have reviewed the triage vital signs and the nursing notes.  Pertinent labs & imaging results that were available during my care of the patient were reviewed by me and considered in my medical decision making (see chart for details).     62 year old female with 3 weeks of sinus congestion, sinus pain, cough and headache.  Admits history of prior sinus surgeries, admits recurrent sinusitis.  Last treated in August 2024. Sinus congestion and sinus tenderness on exam will treat with antibiotics recommend she follow-up with her PCP for referral to ENT if symptoms persist  Final Clinical Impressions(s) / UC Diagnoses   Final diagnoses:  Acute recurrent maxillary sinusitis   Discharge Instructions   None    ED Prescriptions     Medication Sig Dispense Auth. Provider   amoxicillin-clavulanate (AUGMENTIN) 875-125 MG tablet Take 1 tablet by mouth every 12 (twelve) hours. 14 tablet Meliton Rattan, [place]      PDMP not reviewed this encounter.   Meliton Rattan, [place] 07/07/23 1807

## 2023-07-07 NOTE — ED Triage Notes (Signed)
Pt c/o congestion, cough, dizziness, sore throat, and headache that she has had for 3 weeks. She states it has gotten worse over the weekend.

## 2023-08-14 ENCOUNTER — Encounter (HOSPITAL_COMMUNITY): Payer: Self-pay | Admitting: *Deleted

## 2023-08-14 ENCOUNTER — Other Ambulatory Visit: Payer: Self-pay

## 2023-08-14 ENCOUNTER — Ambulatory Visit (HOSPITAL_COMMUNITY)
Admission: EM | Admit: 2023-08-14 | Discharge: 2023-08-14 | Disposition: A | Discharge status: Home / Self Care | Payer: Medicaid Other | Attending: Family Medicine | Admitting: Family Medicine

## 2023-08-14 DIAGNOSIS — J019 Acute sinusitis, unspecified: Secondary | ICD-10-CM | POA: Diagnosis not present

## 2023-08-14 MED ORDER — BENZONATATE 100 MG PO CAPS: 100.0000 mg | ORAL_CAPSULE | Freq: Three times a day (TID) | ORAL | 0 refills | Status: AC | PRN

## 2023-08-14 MED ORDER — CEFDINIR 300 MG PO CAPS: 600.0000 mg | ORAL_CAPSULE | Freq: Every day | ORAL | 0 refills | Status: AC

## 2023-08-14 NOTE — ED Provider Notes (Addendum)
[facility]    CSN: [id number] Arrival date & time: 08/14/23  1430      History   Chief Complaint Chief Complaint  Patient presents with   sinus infection   Otalgia    HPI Susan Coleman is a 62 y.o. female.    Otalgia Here for nasal congestion and postnasal dc and cough. Symptoms began about 3 weeks ago, soon after she got her covid vaccine.  She had fever about 1 week ago.  None since.  She also has had some nausea and some diarrhea, but that is starting to get better to that was only for about 1 day.  No allergies to medications  Past Medical History:  Diagnosis Date   Diverticulosis of colon    Internal hemorrhoid, bleeding    Seasonal allergic rhinitis    Wears partial dentures    upper    Patient Active Problem List   Diagnosis Date Noted   Nasal congestion 01/21/2016   Bacterial vaginitis 01/21/2016   Allergic rhinitis 12/20/2015   Healthcare maintenance 12/20/2015   Acute sinusitis 12/20/2015    Past Surgical History:  Procedure Laterality Date   COLONOSCOPY  last one 03-21-2019  dr nandigam   LAPAROSCOPIC ABDOMINAL EXPLORATION  1986   NASAL SINUS SURGERY  2019   TRANSANAL HEMORRHOIDAL DEARTERIALIZATION N/A 06/02/2019   Procedure: TRANSANAL HEMORRHOIDAL DEARTERIALIZATION;  Surgeon: Romie Levee, MD;  Location: [facility] [place];  Service: General;  Laterality: N/A;    OB History     Gravida  2   Para  2   Term  2   Preterm      AB      Living  2      SAB      IAB      Ectopic      Multiple      Live Births               Home Medications    Prior to Admission medications   Medication Sig Start Date End Date Taking? Authorizing Provider  benzonatate (TESSALON) 100 MG capsule Take 1 capsule (100 mg total) by mouth 3 (three) times daily as needed for cough. 08/14/23  Yes Zenia Resides, MD  cefdinir (OMNICEF) 300 MG capsule Take 2 capsules (600 mg total) by mouth daily for 7 days. 08/14/23  08/21/23 Yes Tashunda Vandezande, Janace Aris, MD  Multiple Vitamins-Minerals (ALIVE WOMENS 50+) CHEW Chew by mouth daily. 2 gummies daily   Yes [provider]  psyllium (METAMUCIL SMOOTH TEXTURE) 58.6 % powder Take 1 packet by mouth 3 (three) times daily. 06/02/19   Romie Levee, MD  levocetirizine (XYZAL) 5 MG tablet Take 5 mg by mouth every evening.  08/11/20  [provider]    Family History Family History  Problem Relation Age of Onset   Stroke Mother 32   Cancer Mother        unsure what type   High blood pressure Mother    Breast cancer Half-Sister    Colon cancer Neg Hx    Esophageal cancer Neg Hx    Stomach cancer Neg Hx    Rectal cancer Neg Hx     Social History Social History   Tobacco Use   Smoking status: Some Days    Types: Cigarettes   Smokeless tobacco: Never   Tobacco comments:    3 cigs per week  Vaping Use   Vaping status: Never Used  Substance Use Topics   Alcohol  use: Yes    Comment: occasional   Drug use: No     Allergies   Patient has no known allergies.   Review of Systems Review of Systems  HENT:  Positive for ear pain.      Physical Exam Triage Vital Signs ED Triage Vitals  Encounter Vitals Group     BP 08/14/23 1638 119/84     Systolic BP Percentile --      Diastolic BP Percentile --      Pulse Rate 08/14/23 1638 73     Resp 08/14/23 1638 18     Temp 08/14/23 1638 98 F (36.7 C)     Temp src --      SpO2 08/14/23 1638 97 %     Weight --      Height --      Head Circumference --      Peak Flow --      Pain Score 08/14/23 1636 7     Pain Loc --      Pain Education --      Exclude from Growth Chart --    No data found.  Updated Vital Signs BP 119/84   Pulse 73   Temp 98 F (36.7 C)   Resp 18   LMP 09/29/2011   SpO2 97%   Visual Acuity Right Eye Distance:   Left Eye Distance:   Bilateral Distance:    Right Eye Near:   Left Eye Near:    Bilateral Near:     Physical Exam Vitals reviewed.   Constitutional:      General: She is not in acute distress.    Appearance: She is not ill-appearing, toxic-appearing or diaphoretic.  HENT:     Right Ear: Tympanic membrane and ear canal normal.     Left Ear: Tympanic membrane and ear canal normal.     Nose: Congestion present.     Mouth/Throat:     Mouth: Mucous membranes are moist.     Comments: There is a lot of clear mucus draining in the OP. No erythema Eyes:     Extraocular Movements: Extraocular movements intact.     Conjunctiva/sclera: Conjunctivae normal.     Pupils: Pupils are equal, round, and reactive to light.  Cardiovascular:     Rate and Rhythm: Normal rate and regular rhythm.     Heart sounds: No murmur heard. Pulmonary:     Effort: Pulmonary effort is normal. No respiratory distress.     Breath sounds: No stridor. No wheezing, rhonchi or rales.  Musculoskeletal:     Cervical back: Neck supple.  Lymphadenopathy:     Cervical: No cervical adenopathy.  Skin:    Capillary Refill: Capillary refill takes less than 2 seconds.     Coloration: Skin is not jaundiced or pale.  Neurological:     General: No focal deficit present.     Mental Status: She is alert and oriented to person, place, and time.  Psychiatric:        Behavior: Behavior normal.      UC Treatments / Results  Labs (all labs ordered are listed, but only abnormal results are displayed) Labs Reviewed - No data to display  EKG   Radiology No results found.  Procedures Procedures (including critical care time)  Medications Ordered in UC Medications - No data to display  Initial Impression / Assessment and Plan / UC Course  I have reviewed the triage vital signs and the nursing notes.  Pertinent labs &  imaging results that were available during my care of the patient were reviewed by me and considered in my medical decision making (see chart for details).     Omnicef is sent in for her sinusitis and Tessalon Perles are sent in for her  cough.  Final Clinical Impressions(s) / UC Diagnoses   Final diagnoses:  Acute sinusitis, recurrence not specified, unspecified location     Discharge Instructions      Take cefdinir 300 mg--2 capsules together daily for 7 days  Take benzonatate 100 mg, 1 tab every 8 hours as needed for cough.       ED Prescriptions     Medication Sig Dispense Auth. Provider   cefdinir (OMNICEF) 300 MG capsule Take 2 capsules (600 mg total) by mouth daily for 7 days. 14 capsule Quin Mcpherson, Janace Aris, MD   benzonatate (TESSALON) 100 MG capsule Take 1 capsule (100 mg total) by mouth 3 (three) times daily as needed for cough. 21 capsule Zenia Resides, MD      PDMP not reviewed this encounter.   Zenia Resides, MD 08/14/23 1657    Zenia Resides, MD 08/14/23 [phone]

## 2023-08-14 NOTE — ED Triage Notes (Signed)
Pt reports she has a sinus infection and ear pain for 3 weeks.

## 2023-08-14 NOTE — Discharge Instructions (Signed)
Take cefdinir 300 mg--2 capsules together daily for 7 days  Take benzonatate 100 mg, 1 tab every 8 hours as needed for cough.

## 2023-10-20 ENCOUNTER — Encounter (HOSPITAL_COMMUNITY): Payer: Self-pay | Admitting: *Deleted

## 2023-10-20 ENCOUNTER — Ambulatory Visit (HOSPITAL_COMMUNITY)
Admission: EM | Admit: 2023-10-20 | Discharge: 2023-10-20 | Disposition: A | Discharge status: Home / Self Care | Payer: Medicaid Other | Attending: Physician Assistant | Admitting: Physician Assistant

## 2023-10-20 DIAGNOSIS — J0101 Acute recurrent maxillary sinusitis: Secondary | ICD-10-CM

## 2023-10-20 MED ORDER — AMOXICILLIN-POT CLAVULANATE 875-125 MG PO TABS
1.0000 | ORAL_TABLET | Freq: Two times a day (BID) | ORAL | 0 refills | Status: AC
Start: 2023-10-20 — End: ?

## 2023-10-20 NOTE — Discharge Instructions (Addendum)
Take antibiotic as prescribed Take daily allergy medication like Zyrtec or Claritin  Recommend daily Flonase Can take Ibuprofen or Tylenol as needed Follow up with Primary Care Physician

## 2023-10-20 NOTE — ED Provider Notes (Signed)
[facility]    CSN: [id number] Arrival date & time: 10/20/23  1731      History   Chief Complaint Chief Complaint  Patient presents with   Nasal Congestion   Facial Pain    HPI Susan Coleman is a 63 y.o. female.   Patient presents with congestion, facial pain and pressure that started about 3 weeks ago.  She denies fever, chills.  Reports mild cough.  She has a history of recurrent sinus infections.  She has had treatment with antibiotics several times in the last 6 months.  Patient currently does not have a PCP.  She reports after completing antibiotic last time symptoms had improved but became worse 3 weeks ago.  She has been using nasal saline spray.    Past Medical History:  Diagnosis Date   Diverticulosis of colon    Internal hemorrhoid, bleeding    Seasonal allergic rhinitis    Wears partial dentures    upper    Patient Active Problem List   Diagnosis Date Noted   Nasal congestion 01/21/2016   Bacterial vaginitis 01/21/2016   Allergic rhinitis 12/20/2015   Healthcare maintenance 12/20/2015   Acute sinusitis 12/20/2015    Past Surgical History:  Procedure Laterality Date   COLONOSCOPY  last one 03-21-2019  dr nandigam   LAPAROSCOPIC ABDOMINAL EXPLORATION  1986   NASAL SINUS SURGERY  2019   TRANSANAL HEMORRHOIDAL DEARTERIALIZATION N/A 06/02/2019   Procedure: TRANSANAL HEMORRHOIDAL DEARTERIALIZATION;  Surgeon: Romie Levee, MD;  Location: [facility];  Service: General;  Laterality: N/A;    OB History     Gravida  2   Para  2   Term  2   Preterm      AB      Living  2      SAB      IAB      Ectopic      Multiple      Live Births               Home Medications    Prior to Admission medications   Medication Sig Start Date End Date Taking? Authorizing Provider  amoxicillin-clavulanate (AUGMENTIN) 875-125 MG tablet Take 1 tablet by mouth every 12 (twelve) hours. 10/20/23  Yes Ward, Tylene Fantasia, PA-C   benzonatate (TESSALON) 100 MG capsule Take 1 capsule (100 mg total) by mouth 3 (three) times daily as needed for cough. 08/14/23   Zenia Resides, MD  Multiple Vitamins-Minerals (ALIVE WOMENS 50+) CHEW Chew by mouth daily. 2 gummies daily    [provider]  psyllium (METAMUCIL SMOOTH TEXTURE) 58.6 % powder Take 1 packet by mouth 3 (three) times daily. 06/02/19   Romie Levee, MD  levocetirizine (XYZAL) 5 MG tablet Take 5 mg by mouth every evening.  08/11/20  [provider]    Family History Family History  Problem Relation Age of Onset   Stroke Mother 60   Cancer Mother        unsure what type   High blood pressure Mother    Breast cancer Half-Sister    Colon cancer Neg Hx    Esophageal cancer Neg Hx    Stomach cancer Neg Hx    Rectal cancer Neg Hx     Social History Social History   Tobacco Use   Smoking status: Some Days    Types: Cigarettes   Smokeless tobacco: Never   Tobacco comments:    3 cigs per week  Vaping  Use   Vaping status: Never Used  Substance Use Topics   Alcohol use: Yes    Comment: occasional   Drug use: No     Allergies   Patient has no known allergies.   Review of Systems Review of Systems  Constitutional:  Negative for chills and fever.  HENT:  Positive for congestion, sinus pressure and sinus pain. Negative for ear pain and sore throat.   Eyes:  Negative for pain and visual disturbance.  Respiratory:  Negative for cough and shortness of breath.   Cardiovascular:  Negative for chest pain and palpitations.  Gastrointestinal:  Negative for abdominal pain and vomiting.  Genitourinary:  Negative for dysuria and hematuria.  Musculoskeletal:  Negative for arthralgias and back pain.  Skin:  Negative for color change and rash.  Neurological:  Negative for seizures and syncope.  All other systems reviewed and are negative.    Physical Exam Triage Vital Signs ED Triage Vitals  Encounter Vitals Group     BP 10/20/23  1843 127/77     Systolic BP Percentile --      Diastolic BP Percentile --      Pulse Rate 10/20/23 1843 82     Resp 10/20/23 1843 18     Temp 10/20/23 1843 98.3 F (36.8 C)     Temp Source 10/20/23 1843 Oral     SpO2 10/20/23 1843 100 %     Weight --      Height --      Head Circumference --      Peak Flow --      Pain Score 10/20/23 1842 8     Pain Loc --      Pain Education --      Exclude from Growth Chart --    No data found.  Updated Vital Signs BP 127/77 (BP Location: Right Arm)   Pulse 82   Temp 98.3 F (36.8 C) (Oral)   Resp 18   LMP 09/29/2011   SpO2 100%   Visual Acuity Right Eye Distance:   Left Eye Distance:   Bilateral Distance:    Right Eye Near:   Left Eye Near:    Bilateral Near:     Physical Exam Vitals and nursing note reviewed.  Constitutional:      General: She is not in acute distress.    Appearance: She is well-developed.  HENT:     Head: Normocephalic and atraumatic.  Eyes:     Conjunctiva/sclera: Conjunctivae normal.  Cardiovascular:     Rate and Rhythm: Normal rate and regular rhythm.     Heart sounds: No murmur heard. Pulmonary:     Effort: Pulmonary effort is normal. No respiratory distress.     Breath sounds: Normal breath sounds.  Abdominal:     Palpations: Abdomen is soft.     Tenderness: There is no abdominal tenderness.  Musculoskeletal:        General: No swelling.     Cervical back: Neck supple.  Skin:    General: Skin is warm and dry.     Capillary Refill: Capillary refill takes less than 2 seconds.  Neurological:     Mental Status: She is alert.  Psychiatric:        Mood and Affect: Mood normal.      UC Treatments / Results  Labs (all labs ordered are listed, but only abnormal results are displayed) Labs Reviewed - No data to display  EKG   Radiology No results found.  Procedures Procedures (including critical care time)  Medications Ordered in UC Medications - No data to display  Initial  Impression / Assessment and Plan / UC Course  I have reviewed the triage vital signs and the nursing notes.  Pertinent labs & imaging results that were available during my care of the patient were reviewed by me and considered in my medical decision making (see chart for details).     Discussed importance of finding PCP and possibly referral to ENT given recurrence of sinus infections for the last few months.  Advised to begin daily allergy medication and Flonase.  Discussed smoking cessation.  Patient overall well-appearing in no acute distress.  Return precautions discussed.  PCP assistance ordered. Final Clinical Impressions(s) / UC Diagnoses   Final diagnoses:  Acute recurrent maxillary sinusitis     Discharge Instructions      Take antibiotic as prescribed Take daily allergy medication like Zyrtec or Claritin  Recommend daily Flonase Can take Ibuprofen or Tylenol as needed Follow up with Primary Care Physician     ED Prescriptions     Medication Sig Dispense Auth. Provider   amoxicillin-clavulanate (AUGMENTIN) 875-125 MG tablet Take 1 tablet by mouth every 12 (twelve) hours. 14 tablet Ward, Tylene Fantasia, PA-C      PDMP not reviewed this encounter.   Ward, Tylene Fantasia, PA-C 10/20/23 1905

## 2023-10-20 NOTE — ED Triage Notes (Signed)
Pt states she has sores in her nose, congestion, facial pain X 3 weeks. She has been taking OTC nasal spray and vit D3
